# Patient Record
Sex: Female | Born: 1980 | Race: Black or African American | Hispanic: No | Smoking: Never smoker
Health system: Southern US, Community
[De-identification: ages and names within clinical notes are randomized; demographics above are authoritative.]

---

## 2010-08-28 HISTORY — PX: BREAST BIOPSY: SHX20

## 2011-01-24 ENCOUNTER — Other Ambulatory Visit: Payer: Self-pay | Admitting: Internal Medicine

## 2011-01-24 DIAGNOSIS — IMO0002 Reserved for concepts with insufficient information to code with codable children: Secondary | ICD-10-CM

## 2011-01-27 ENCOUNTER — Ambulatory Visit
Admission: RE | Admit: 2011-01-27 | Discharge: 2011-01-27 | Disposition: A | Discharge status: Home / Self Care | Payer: 59 | Source: Ambulatory Visit | Attending: Internal Medicine | Admitting: Internal Medicine

## 2011-01-27 ENCOUNTER — Other Ambulatory Visit: Payer: Self-pay | Admitting: Internal Medicine

## 2011-01-27 DIAGNOSIS — IMO0002 Reserved for concepts with insufficient information to code with codable children: Secondary | ICD-10-CM

## 2011-02-01 ENCOUNTER — Other Ambulatory Visit: Payer: Self-pay | Admitting: Diagnostic Radiology

## 2011-02-01 ENCOUNTER — Ambulatory Visit
Admission: RE | Admit: 2011-02-01 | Discharge: 2011-02-01 | Disposition: A | Discharge status: Home / Self Care | Payer: 59 | Source: Ambulatory Visit | Attending: Internal Medicine | Admitting: Internal Medicine

## 2011-02-01 DIAGNOSIS — IMO0002 Reserved for concepts with insufficient information to code with codable children: Secondary | ICD-10-CM

## 2011-12-28 IMAGING — MG MM DIGITAL DIAGNOSTIC BILAT {BCG}
5 series · 5 of 5 positions shown · non-contrast
Comparison: None.

CLINICAL DATA: Patient presents for a bilateral diagnostic
mammogram due to a palpable abnormality over the outer left breast.

DIGITAL DIAGNOSTIC BILATERAL MAMMOGRAM WITH CAD AND LEFT BREAST
ULTRASOUND:

[R CC]
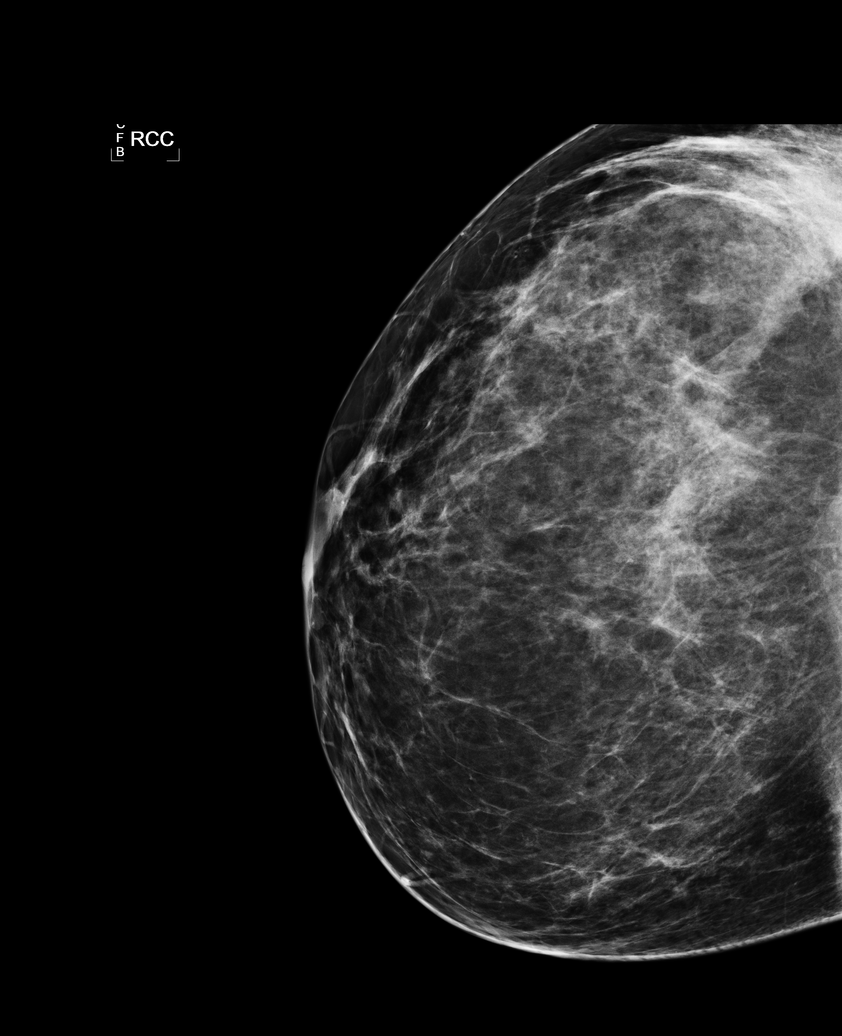

[L CC (1 of 2)]
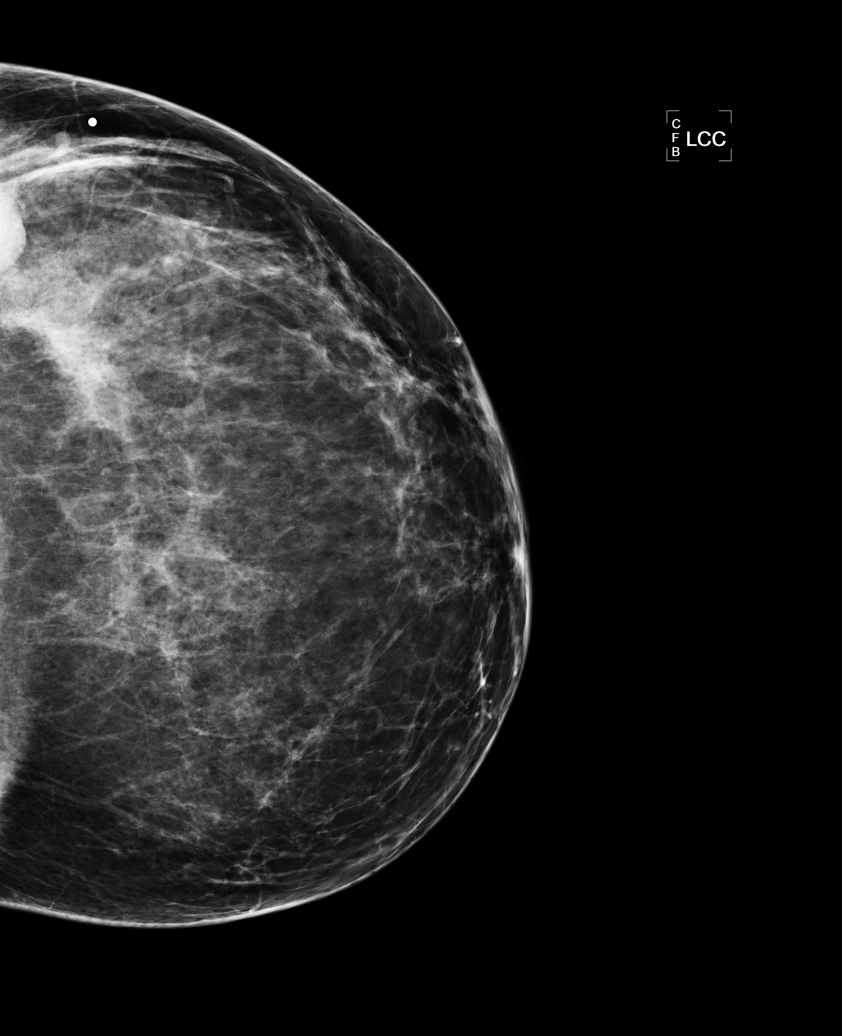

[L MLO]
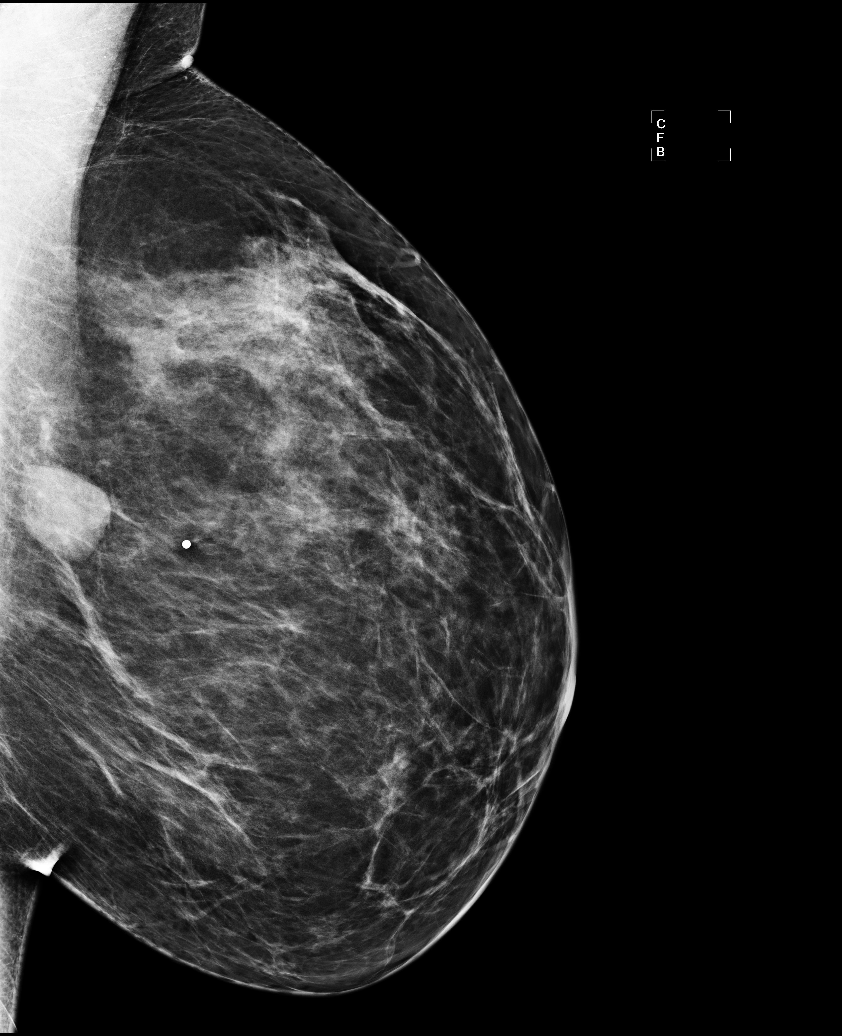

[R MLO]
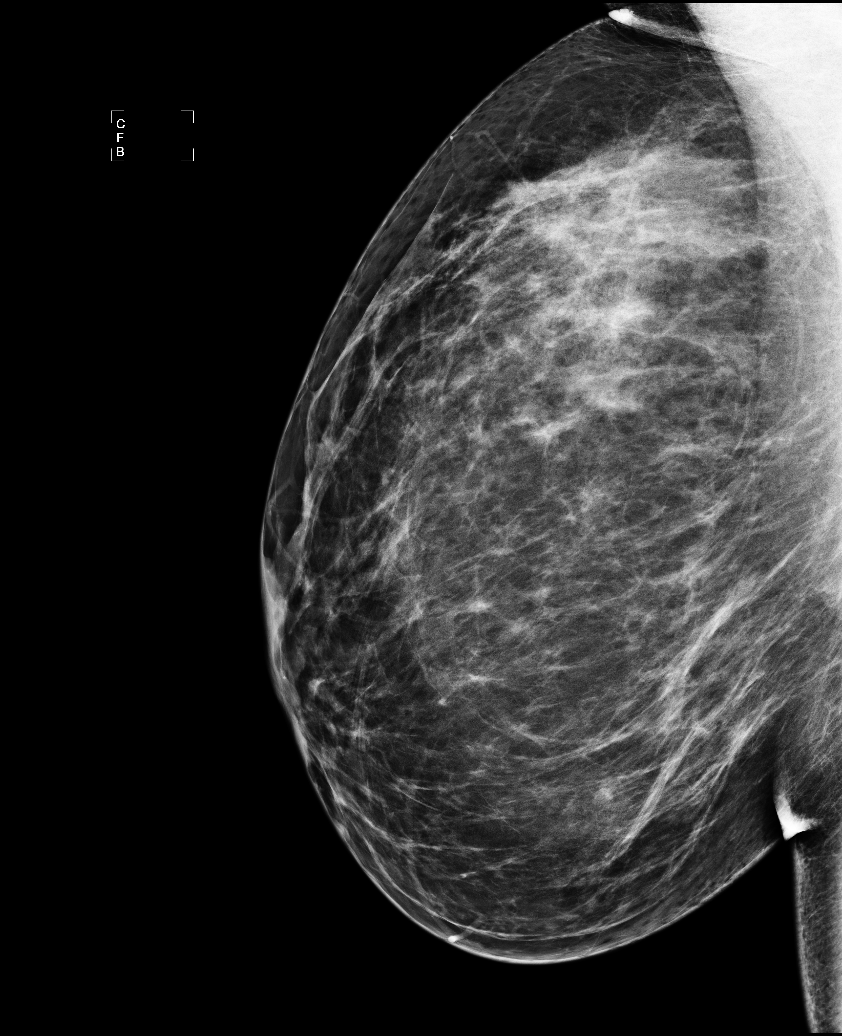

[L CC (2 of 2)]
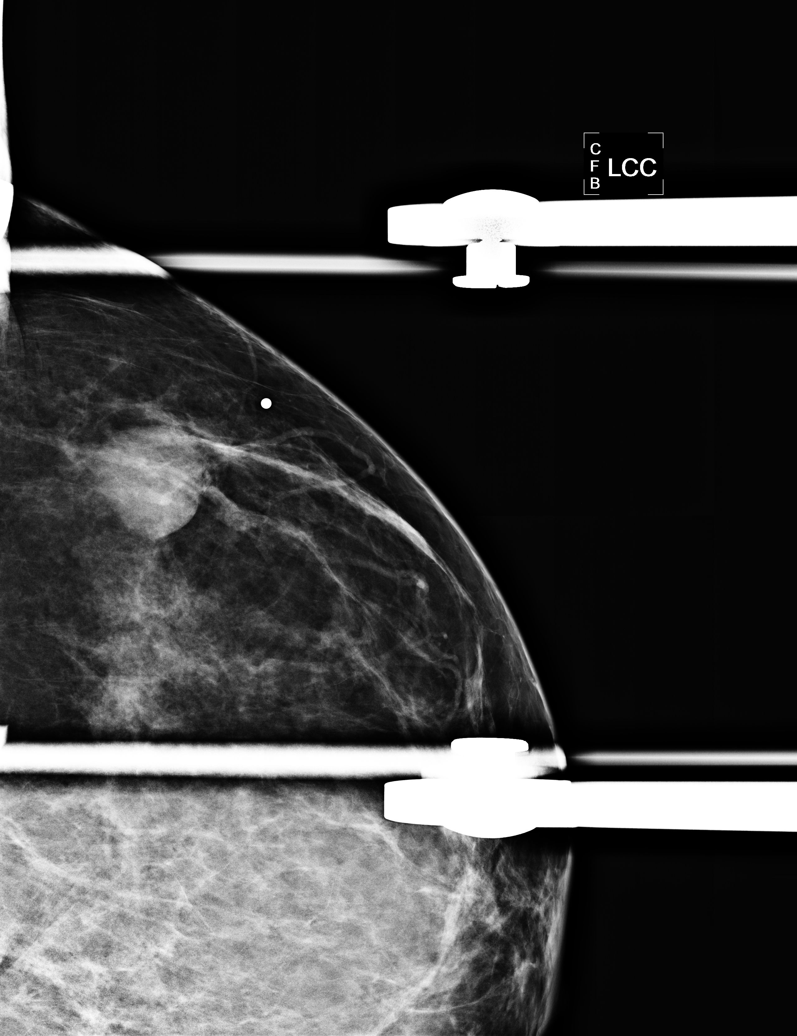

[5 of 5 positions shown; findings below may reference images not displayed]

FINDINGS: Exam demonstrates scattered fibroglandular densities.
There is a dense rounded mass of the outer mid left breast with
predominantly circumscribed borders but with some ill definition of
its posterior border.  Remainder of the exam is unremarkable.
Mammographic images were processed with CAD.

Ultrasound is performed, showing a rounded hypoechoic solid mass at
the 3 o'clock position of the left breast 15 cm from the nipple
corresponding to the palpable abnormality and mammographic mass.
This mass measures 1.4 x 1.9 x 2.4 cm.  There is mild lobulation of
the borders.
IMPRESSION: Hypoechoic solid mass measuring 1.4 x 1.9 x 2.4 cm over the 3
o'clock position of the left breast 15 cm from the nipple
corresponding to patient's palpable abnormality. This likely
represents a fibroadenoma, although there are a few atypical
features.

Recommendations: Would recommend ultrasound-guided core biopsy.

BI-RADS CATEGORY 4:  Suspicious abnormality - biopsy should be
considered.

Patient is scheduled for ultrasound-guided core biopsy 02/01/2011.

## 2012-01-02 IMAGING — MG MM DIAGNOSTIC UNILATERAL L
3 series · 3 of 3 positions shown · non-contrast
Comparison: Left breast biopsy 02/01/2011

CLINICAL DATA: Biopsy was performed of a palpable left breast
mass.

DIGITAL DIAGNOSTIC LEFT MAMMOGRAM

[L CC]
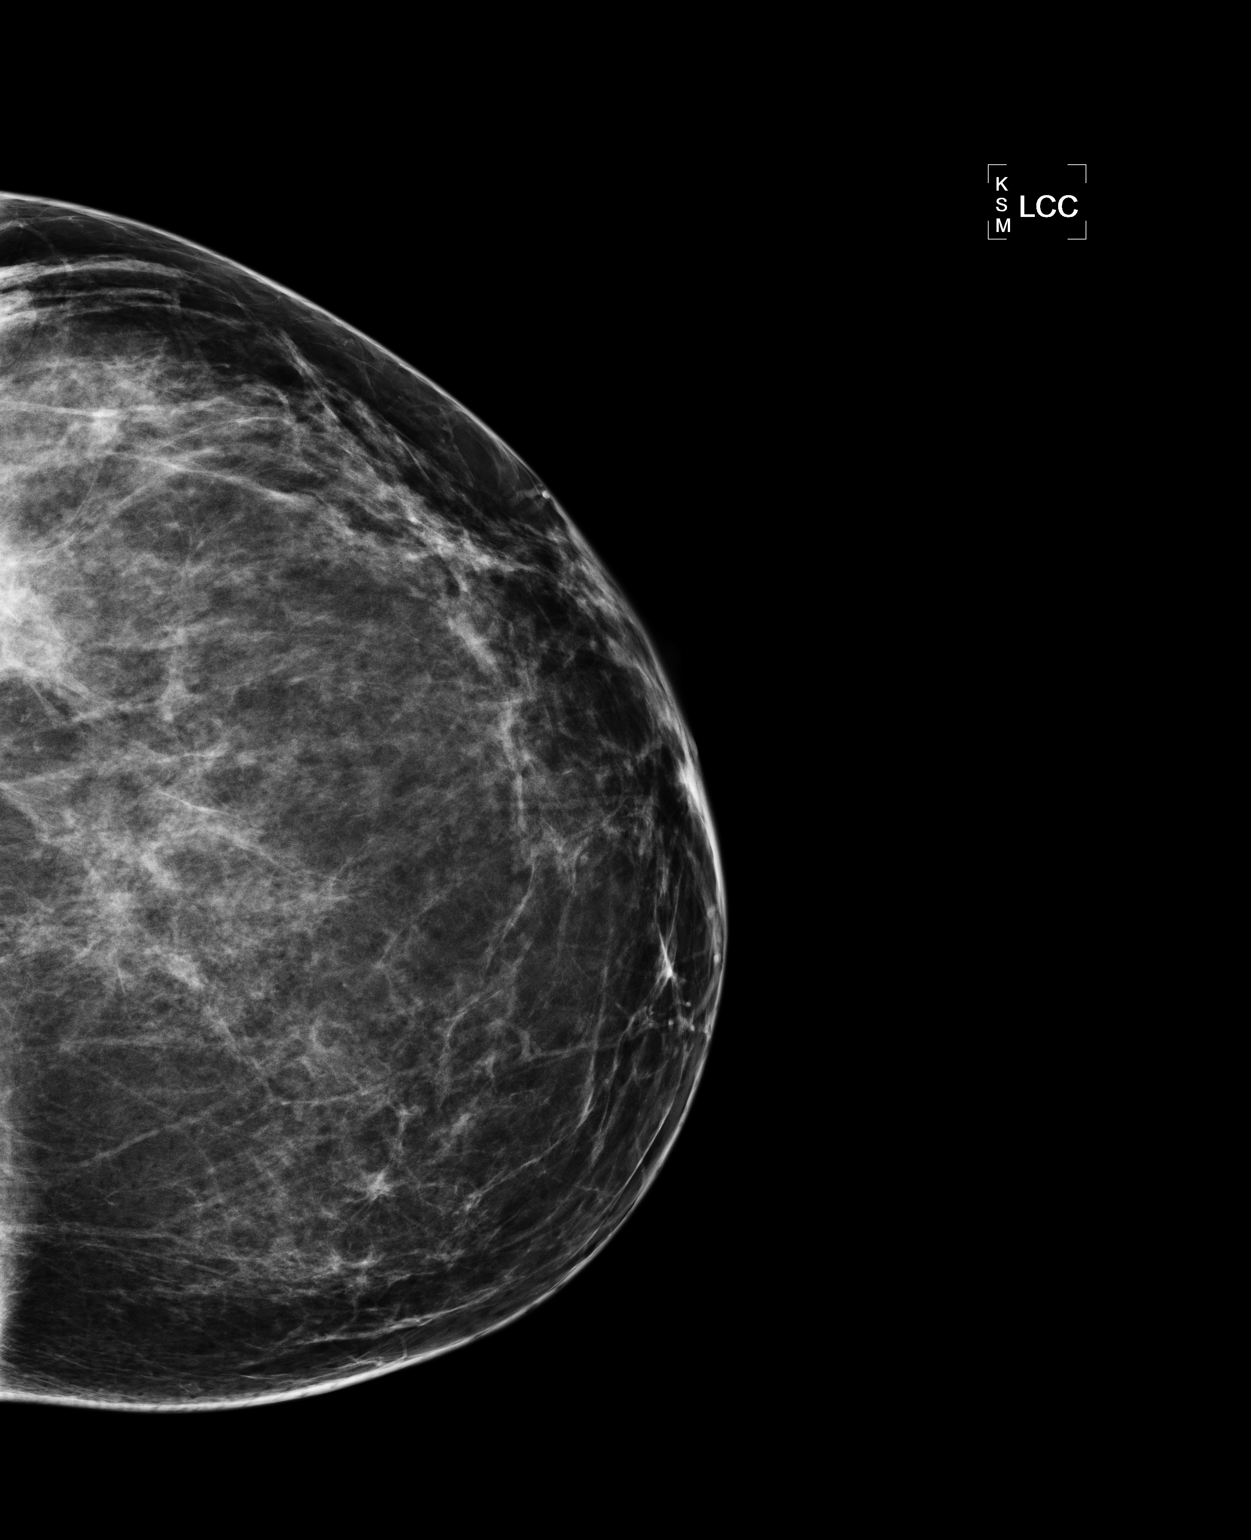

[L MLO]
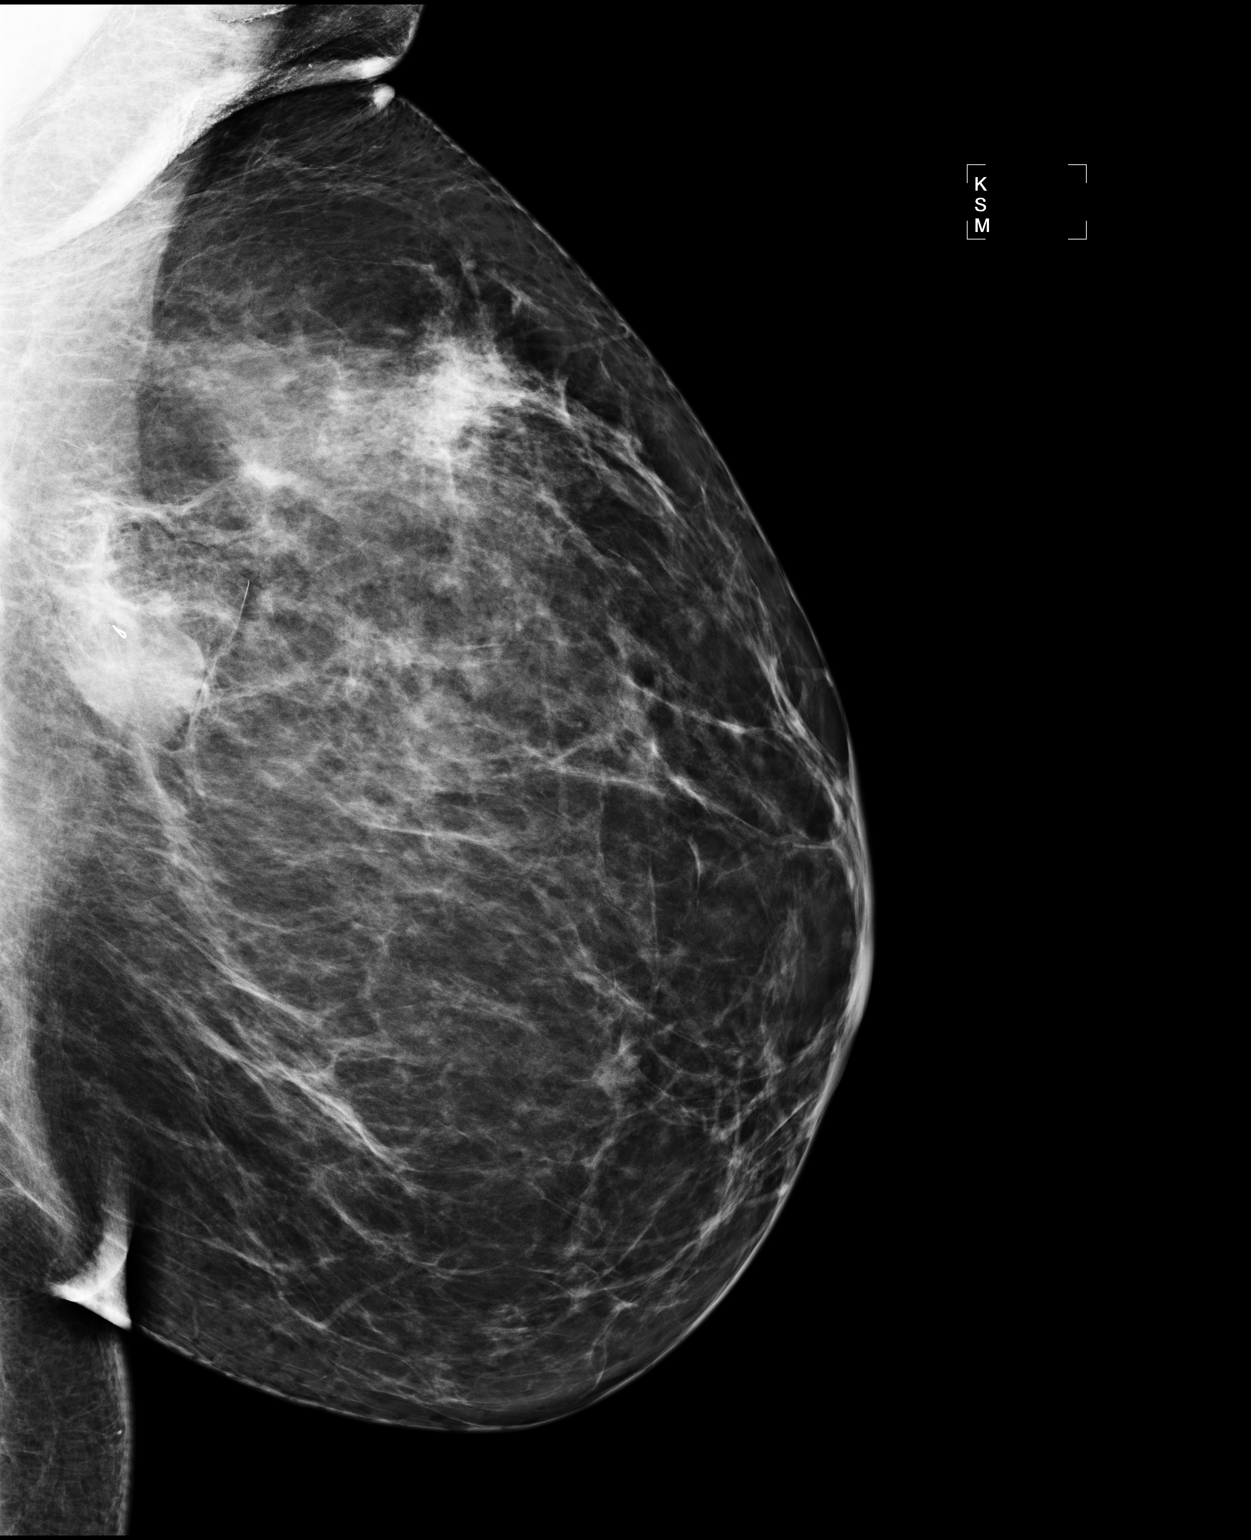

[L XCCL]
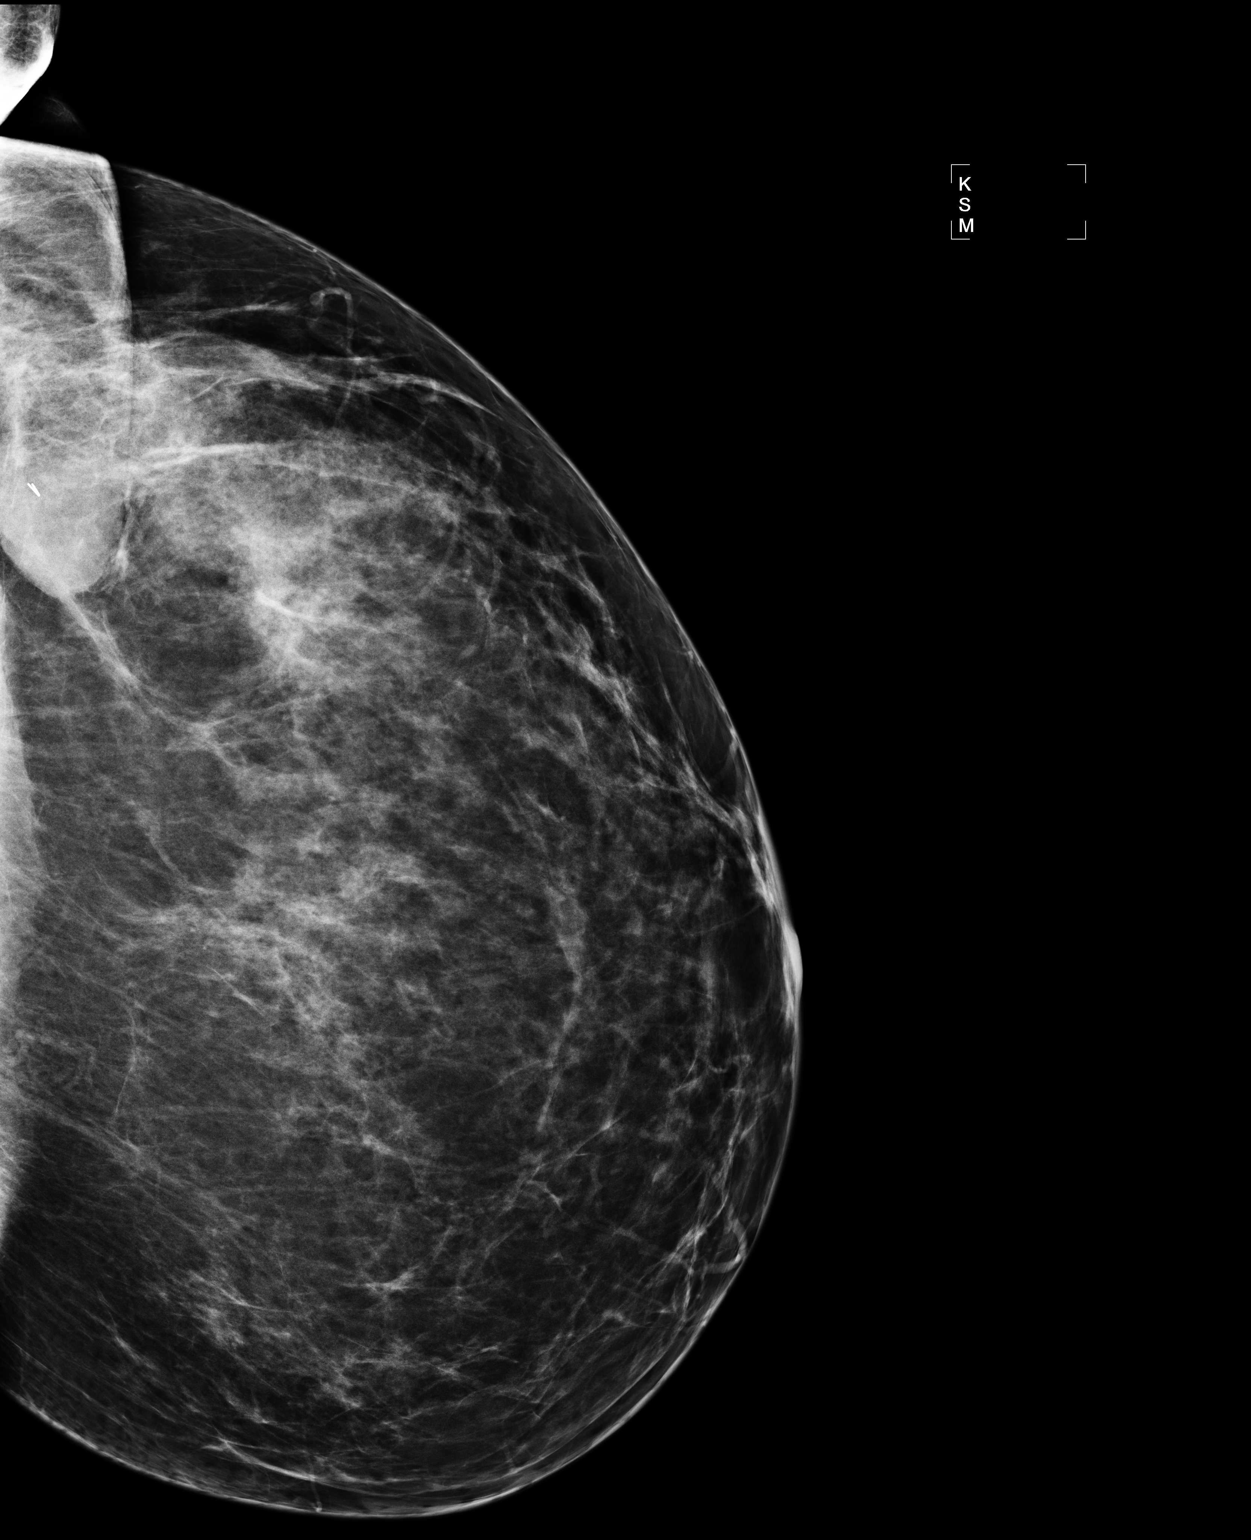

[3 of 3 positions shown; findings below may reference images not displayed]

FINDINGS: Films are performed following ultrasound guided biopsy
of a 2.4 cm palpable mass left breast three o'clock position.  The
biopsy clip is satisfactorily positioned within the mass.
IMPRESSION: Satisfactory position of biopsy clip in the biopsied left breast
mass.

## 2012-01-02 IMAGING — US US CORE BIOPSY
1 series · 9 of 9 positions shown · non-contrast
Comparison: none

CLINICAL DATA: Probable 1.4 cm solid mass left breast three
o'clock position.  Biopsy is recommended.

[Series 1: us core biopsy · 9 of 9 slices shown]
[im 1/9]
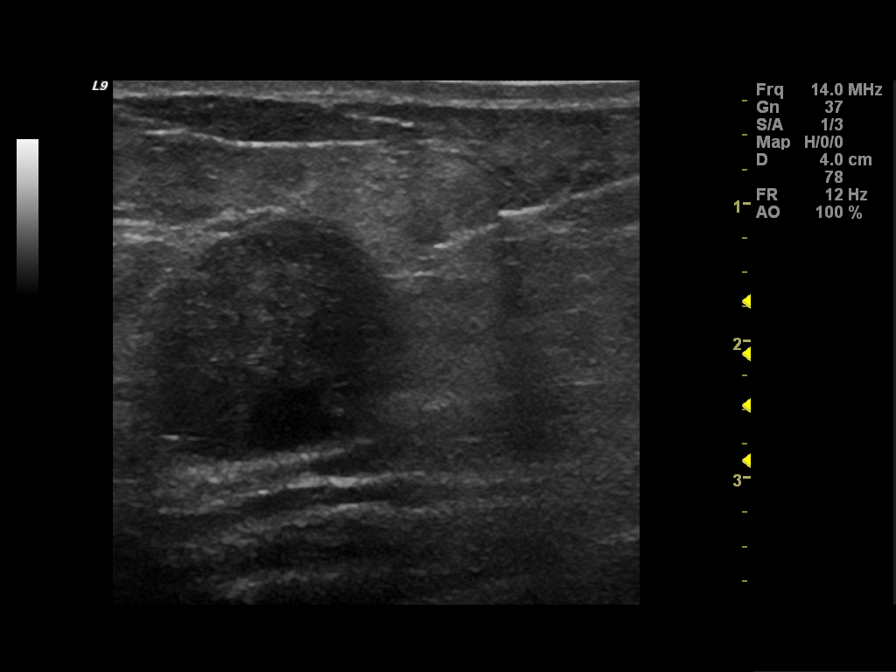
[im 2/9]
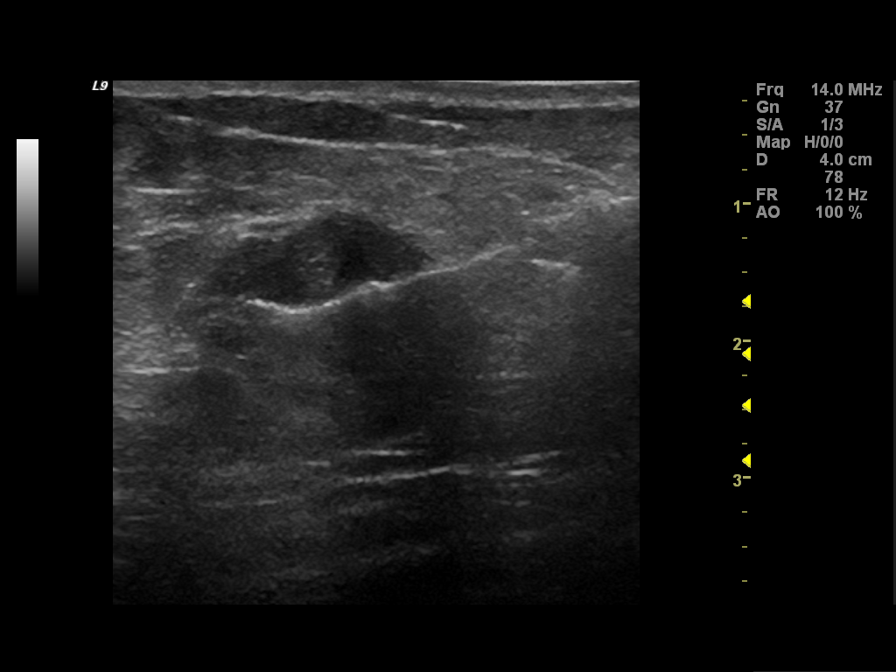
[im 3/9]
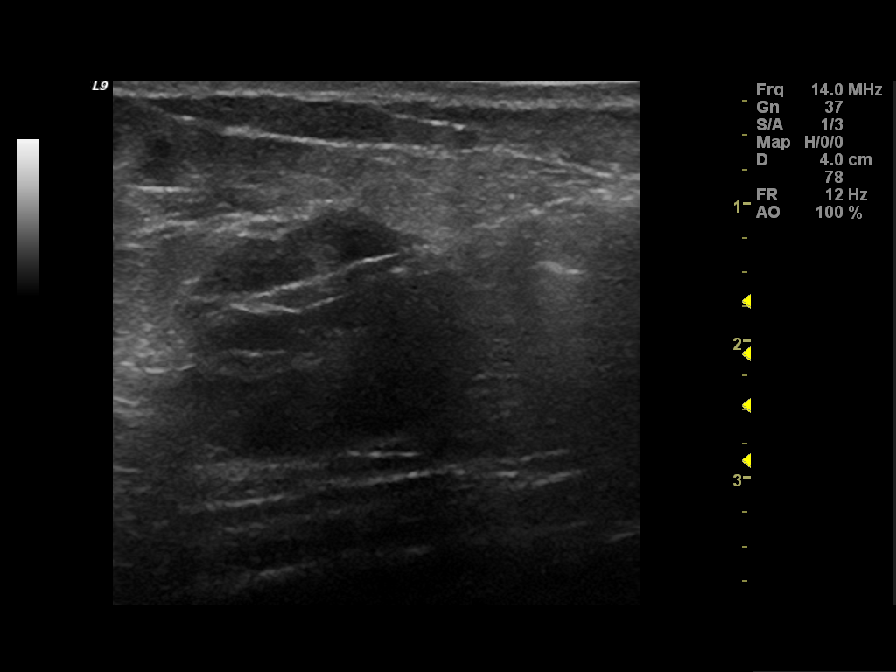
[im 4/9]
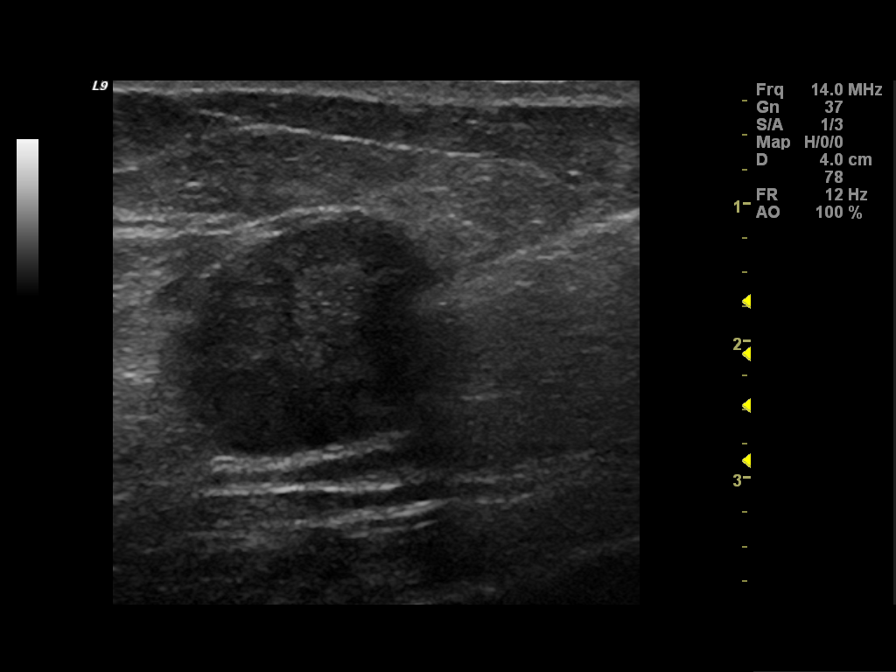
[im 5/9]
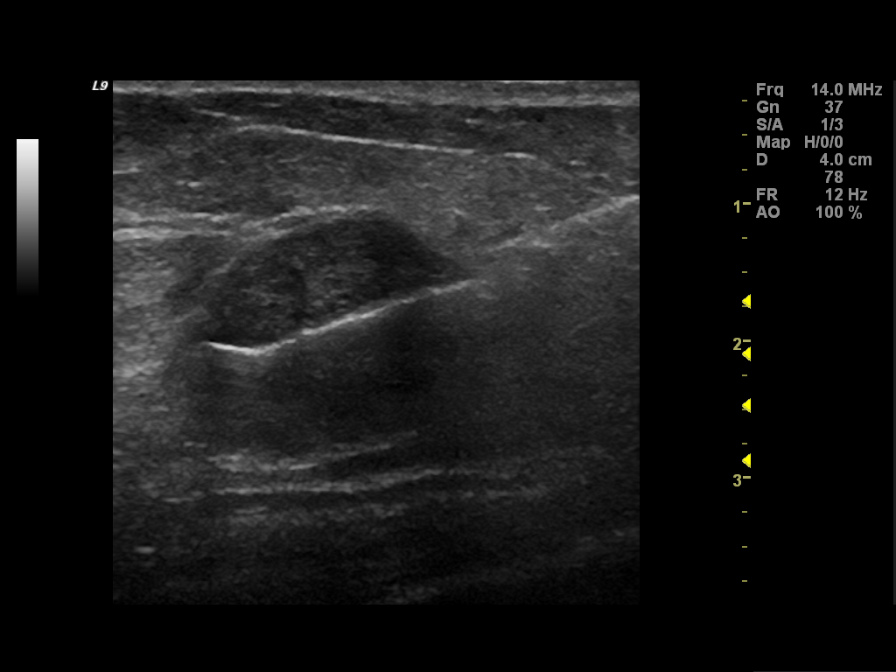
[im 6/9]
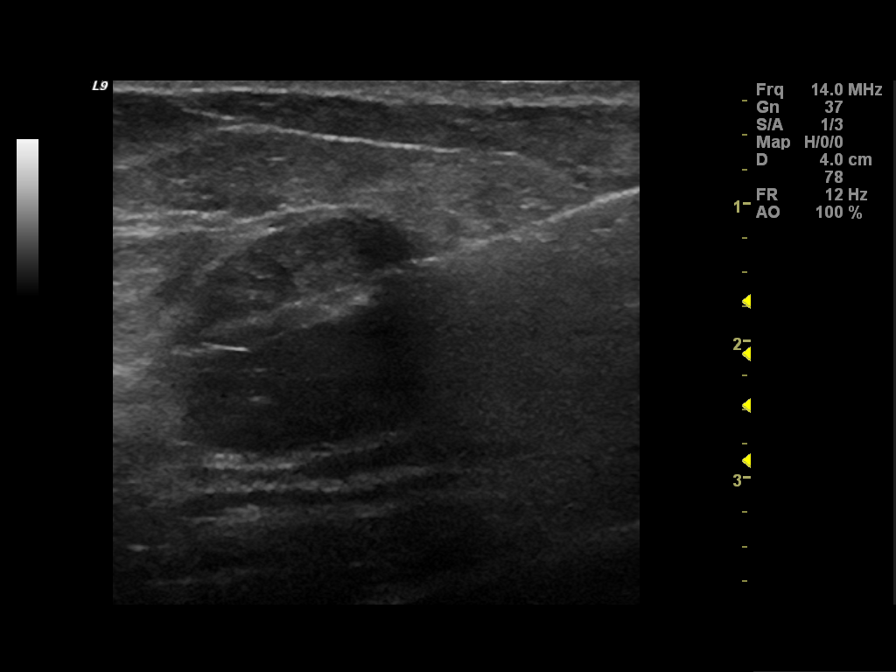
[im 7/9]
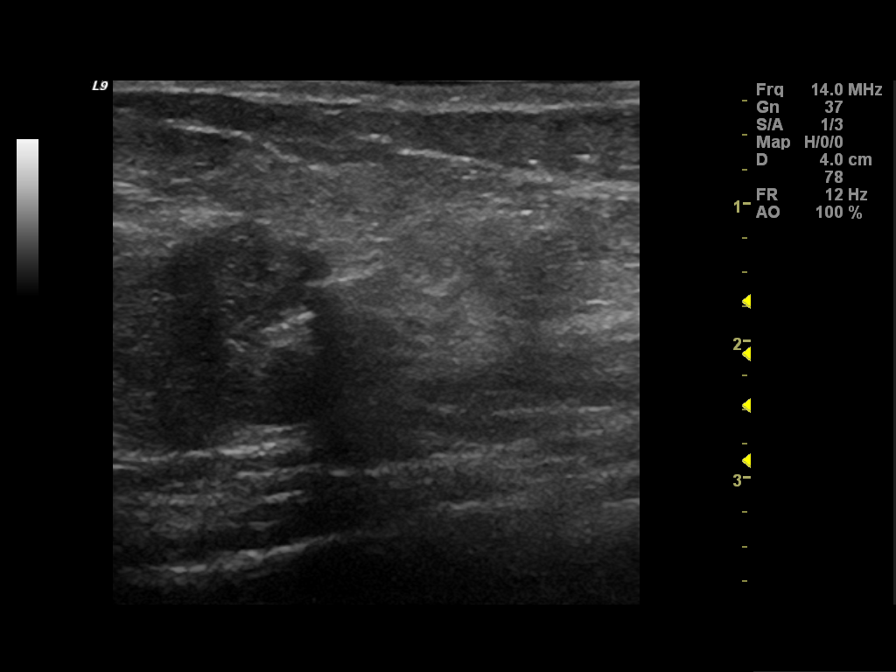
[im 8/9]
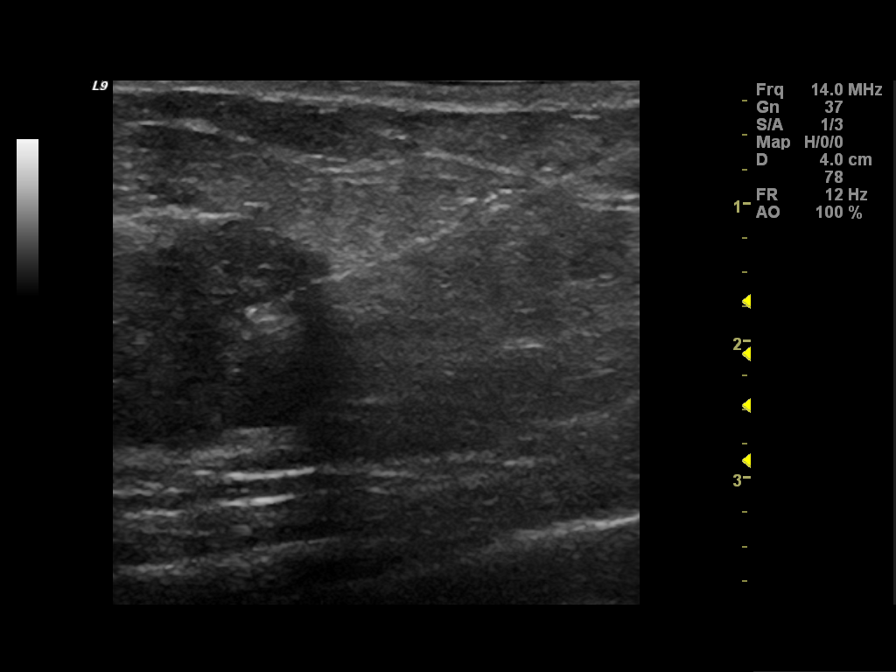
[im 9/9]
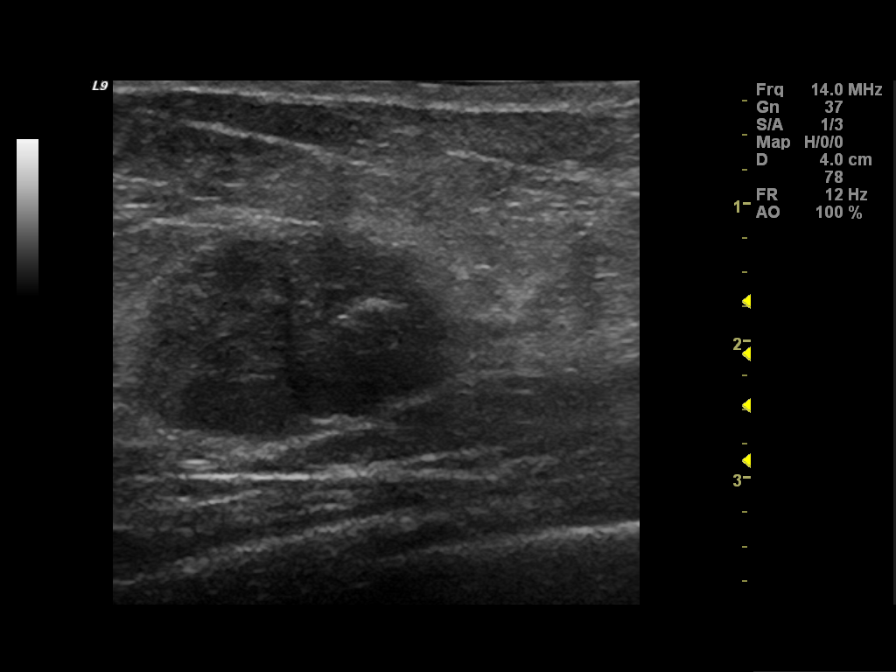

[9 of 9 positions shown; findings below may reference images not displayed]

ULTRASOUND GUIDED VACUUM ASSISTED CORE BIOPSY OF THE LEFT BREAST

I met with the patient, and we discussed the procedure of
ultrasound-guided biopsy, including risks, benefits, and
alternatives.  Specifically, we discussed the risks of infection,
bleeding, tissue injury, clip migration, and inadequate sampling.
Informed, written consent was given.

Using sterile technique, 2% lidocaine, ultrasound guidance, and a
12 gauge vacuum assisted needle, biopsy was performed of a left
breast mass at 3 o'clock position.  At the conclusion of the
procedure, a tissue marker clip was deployed into the biopsy
cavity.  Follow-up 2-view mammogram was performed and dictated
separately.
IMPRESSION: Ultrasound-guided biopsy of left breast mass.  No apparent
complications.

## 2019-05-02 ENCOUNTER — Other Ambulatory Visit: Payer: Self-pay | Admitting: Obstetrics and Gynecology

## 2019-05-02 ENCOUNTER — Other Ambulatory Visit (HOSPITAL_COMMUNITY)
Admission: RE | Admit: 2019-05-02 | Discharge: 2019-05-02 | Disposition: A | Discharge status: Home / Self Care | Payer: 59 | Source: Ambulatory Visit | Attending: Obstetrics and Gynecology | Admitting: Obstetrics and Gynecology

## 2019-05-02 DIAGNOSIS — Z124 Encounter for screening for malignant neoplasm of cervix: Secondary | ICD-10-CM | POA: Diagnosis present

## 2019-05-09 LAB — CYTOLOGY - PAP
Diagnosis: UNDETERMINED — AB
HPV 16/18/45 genotyping: POSITIVE — AB
HPV: DETECTED — AB

## 2019-05-16 ENCOUNTER — Other Ambulatory Visit: Payer: Self-pay | Admitting: Obstetrics and Gynecology

## 2020-01-15 ENCOUNTER — Ambulatory Visit: Payer: 59 | Attending: Internal Medicine

## 2020-01-15 DIAGNOSIS — Z23 Encounter for immunization: Secondary | ICD-10-CM

## 2020-01-15 NOTE — Progress Notes (Signed)
   Covid-19 Vaccination Clinic  Name:  Cheryl Joyce    MRN: [id number] DOB: 1981-02-14  01/15/2020  [name] was observed post Covid-19 immunization for 15 minutes without incident. She was provided with Vaccine Information Sheet and instruction to access the V-Safe system.   [name] was instructed to call 911 with any severe reactions post vaccine: Marland Kitchen Difficulty breathing  . Swelling of face and throat  . A fast heartbeat  . A bad rash all over body  . Dizziness and weakness   Immunizations Administered    Name Date Dose VIS Date Route   Pfizer COVID-19 Vaccine 01/15/2020  3:00 PM 0.3 mL 10/22/2018 Intramuscular   Manufacturer: [place]   Lot: [id number]   [place]: [id number]

## 2020-02-09 ENCOUNTER — Ambulatory Visit: Payer: 59 | Attending: Internal Medicine

## 2020-02-09 DIAGNOSIS — Z23 Encounter for immunization: Secondary | ICD-10-CM

## 2020-02-09 NOTE — Progress Notes (Signed)
° °  Covid-19 Vaccination Clinic  Name:  Cheryl Joyce    MRN: [id number] DOB: 06-Apr-1981  02/09/2020  Cheryl Joyce was observed post Covid-19 immunization for 15 minutes without incident. She was provided with Vaccine Information Sheet and instruction to access the V-Safe system.   Cheryl Joyce was instructed to call 911 with any severe reactions post vaccine:  Difficulty breathing   Swelling of face and throat   A fast heartbeat   A bad rash all over body   Dizziness and weakness   Immunizations Administered    Name Date Dose VIS Date Route   Pfizer COVID-19 Vaccine 02/09/2020  3:56 PM 0.3 mL 10/22/2018 Intramuscular   Manufacturer: [place]   Lot: [id number]   [place]: [address]-7

## 2020-12-13 ENCOUNTER — Ambulatory Visit
Admission: EM | Admit: 2020-12-13 | Discharge: 2020-12-13 | Disposition: A | Discharge status: Home / Self Care | Payer: 59

## 2020-12-13 ENCOUNTER — Other Ambulatory Visit: Payer: Self-pay

## 2020-12-13 DIAGNOSIS — R030 Elevated blood-pressure reading, without diagnosis of hypertension: Secondary | ICD-10-CM

## 2020-12-13 NOTE — ED Provider Notes (Signed)
[facility]    CSN: [id number] Arrival date & time: 12/13/20  1304      History   Chief Complaint Chief Complaint  Patient presents with  . Hypertension    HPI Cheryl Joyce is a 40 y.o. female.   HPI  Patient presents today with a concern for possible high blood pressure. Patient has been taking care of her mother who has been hospitalized over the last two weeks. She used her mother's wrist cuff to check her blood pressure and measure two readings greater than 140/80. No history of hypertension. No CPE within the last 2 years. Schedule to see PCP in 2 weeks. No HA, chest pain, or dizzines. Recent weight gain related to COVID as she no longer works out. Recent poor than baseline diet.   No past medical history on file.  There are no problems to display for this patient.   OB History   No obstetric history on file.      Home Medications    Prior to Admission medications   Not on File    Family History No family history on file.  Social History Social History   Tobacco Use  . Smoking status: Never Smoker  . Smokeless tobacco: Never Used  Substance Use Topics  . Alcohol use: Not Currently  . Drug use: Never     Allergies   Penicillins   Review of Systems Review of Systems Pertinent negatives listed in HPI  Physical Exam Triage Vital Signs ED Triage Vitals  Enc Vitals Group     BP 12/13/20 1406 (!) 145/93     Pulse Rate 12/13/20 1406 80     Resp 12/13/20 1406 20     Temp 12/13/20 1406 97.8 F (36.6 C)     Temp src --      SpO2 12/13/20 1406 95 %     Weight --      Height --      Head Circumference --      Peak Flow --      Pain Score 12/13/20 1404 0     Pain Loc --      Pain Edu? --      Excl. in [place]? --    No data found.  Updated Vital Signs BP (!) 145/93   Pulse 80   Temp 97.8 F (36.6 C)   Resp 20   LMP 12/12/2020   SpO2 95%   Visual Acuity Right Eye Distance:   Left Eye Distance:   Bilateral Distance:     Right Eye Near:   Left Eye Near:    Bilateral Near:     Physical Exam General appearance: alert, well developed, well nourished, cooperative and in no distress Head: Normocephalic, without obvious abnormality, atraumatic Respiratory: Respirations even and unlabored, normal respiratory rate Heart: Rate and rhythm normal. No gallop or murmurs noted on exam  Extremities: No gross deformities Skin: Skin color, texture, turgor normal. No rashes seen  Psych: Appropriate mood and affect. Neurologic: GCS 15, normal coordination, normal strength, normal gait   UC Treatments / Results  Labs (all labs ordered are listed, but only abnormal results are displayed) Labs Reviewed - No data to display  EKG   Radiology No results found.  Procedures Procedures (including critical care time)  Medications Ordered in UC Medications - No data to display  Initial Impression / Assessment and Plan / UC Course  I have reviewed the triage vital signs and the nursing notes.  Pertinent  labs & imaging results that were available during my care of the patient were reviewed by me and considered in my medical decision making (see chart for details).     Elevated blood pressure without diagnosis of hypertension. PCP follow-up for further work-up of symptoms.  No indication to start antihypertensive today. Purchase arm cuff. Keep a BP long daily. ER precaution given Final Clinical Impressions(s) / UC Diagnoses   Final diagnoses:  Elevated blood pressure reading without diagnosis of hypertension     Discharge Instructions     Your blood pressure is slightly elevated today however given lack of previous elevated readings I would like for you to obtain a arm blood pressure cuff and check your blood pressure at home at the same time of the day.   I have attached instructions on how to appropriately check your blood pressure on a consistent basis.  Also log your blood pressure readings.  You are  scheduled for follow-up with your primary care doctor within the next 3 weeks take a copy of your blood pressure readings with you to your next visit.    ED Prescriptions    None     PDMP not reviewed this encounter.   Scot Jun, FNP 12/20/20 2350

## 2020-12-13 NOTE — ED Triage Notes (Signed)
Pt states that she checked BP on Saturday and was  In 140s/ 90s. Just concerned and wants to be checked.

## 2020-12-13 NOTE — Discharge Instructions (Addendum)
Your blood pressure is slightly elevated today however given lack of previous elevated readings I would like for you to obtain a arm blood pressure cuff and check your blood pressure at home at the same time of the day.   I have attached instructions on how to appropriately check your blood pressure on a consistent basis.  Also log your blood pressure readings.  You are scheduled for follow-up with your primary care doctor within the next 3 weeks take a copy of your blood pressure readings with you to your next visit.

## 2020-12-29 ENCOUNTER — Encounter: Payer: Self-pay | Admitting: Nurse Practitioner

## 2020-12-29 ENCOUNTER — Ambulatory Visit (INDEPENDENT_AMBULATORY_CARE_PROVIDER_SITE_OTHER): Payer: 59 | Admitting: Nurse Practitioner

## 2020-12-29 ENCOUNTER — Other Ambulatory Visit: Payer: Self-pay

## 2020-12-29 VITALS — BP 128/74 | HR 65 | Temp 98.2°F | Ht 71.0 in | Wt 225.4 lb

## 2020-12-29 DIAGNOSIS — Z7689 Persons encountering health services in other specified circumstances: Secondary | ICD-10-CM

## 2020-12-29 DIAGNOSIS — Z6831 Body mass index (BMI) 31.0-31.9, adult: Secondary | ICD-10-CM

## 2020-12-29 DIAGNOSIS — E6609 Other obesity due to excess calories: Secondary | ICD-10-CM | POA: Diagnosis not present

## 2020-12-29 DIAGNOSIS — I10 Essential (primary) hypertension: Secondary | ICD-10-CM | POA: Diagnosis not present

## 2020-12-29 NOTE — Patient Instructions (Signed)
Preventing Hypertension Hypertension, also called high blood pressure, is when the force of blood pumping through the arteries is too strong. Arteries are blood vessels that carry blood from the heart throughout the body. Often, hypertension does not cause symptoms until blood pressure is very high. It is important to have your blood pressure checked regularly. Diet and lifestyle changes can help you prevent hypertension, and they may make you feel better overall and improve your quality of life. If you already have hypertension, you may control it with diet and lifestyle changes, as well as with medicine. How can this condition affect me? Over time, hypertension can damage the arteries and decrease blood flow to important parts of the body, including the brain, heart, and kidneys. By keeping your blood pressure in a healthy range, you can help prevent complications like heart attack, heart failure, stroke, kidney failure, and vascular dementia. What can increase my risk?  Being an older adult. Older people are more often affected.  Having family members who have had high blood pressure.  Being obese.  Being female. Males are more likely to have high blood pressure.  Drinking too much alcohol or caffeine.  Smoking or using illegal drugs.  Taking certain medicines, such as antidepressants, decongestants, birth control pills, and NSAIDs, such as ibuprofen.  Having thyroid problems.  Having certain tumors. What actions can I take to prevent or manage this condition? Work with your health care provider to make a hypertension prevention plan that works for you. Follow your plan and keep all follow-up visits as told by your health care provider. Diet changes Maintain a healthy diet. This includes:  Eating less salt (sodium). Ask your health care provider how much sodium is safe for you to have. The general recommendation is to have less than 1 tsp (2,300 mg) of sodium a day. ? Do not add salt  to your food. ? Choose low-sodium options when grocery shopping and eating out.  Limiting fats in your diet. You can do this by eating low-fat or fat-free dairy products and by eating less red meat.  Eating more fruits, vegetables, and whole grains. Make a goal to eat: ? 1-2 cups of fresh fruits and vegetables each day. ? 3-4 servings of whole grains each day.  Avoiding foods and beverages that have added sugars.  Eating fish that contain healthy fats (omega-3 fatty acids), such as mackerel or salmon. If you need help putting together a healthy eating plan, try the DASH diet. This diet is high in fruits, vegetables, and whole grains. It is low in sodium, red meat, and added sugars. DASH stands for Dietary Approaches to Stop Hypertension.   Lifestyle changes Lose weight if you are overweight. Losing just 3?5% of your body weight can help prevent or control hypertension. For example, if your present weight is 200 lb (91 kg), a loss of 3-5% of your weight means losing 6-10 lb (2.7-4.5 kg). Ask your health care provider to help you with a diet and exercise plan to safely lose weight. Other recommendations usually include:  Get enough exercise. Do at least 150 minutes of moderate-intensity exercise each week. You could do this in short exercise sessions several times a day, or you could do longer exercise sessions a few times a week. For example, you could take a brisk 10-minute walk or bike ride, 3 times a day, for 5 days a week.  Find ways to reduce stress, such as exercising, meditating, listening to music, or taking a yoga class.  If you need help reducing stress, ask your health care provider.  Do not use any products that contain nicotine or tobacco, such as cigarettes, e-cigarettes, and chewing tobacco. If you need help quitting, ask your health care provider. Chemicals in tobacco and nicotine products raise your blood pressure each time you use them. If you need help quitting, ask your health  care provider.  Learn how to check your blood pressure at home. Make sure that you know your personal target blood pressure, as told by your health care provider.  Try to sleep 7-9 hours per night.   Alcohol use  Do not drink alcohol if: ? Your health care provider tells you not to drink. ? You are pregnant, may be pregnant, or are planning to become pregnant.  If you drink alcohol: ? Limit how much you use to:  0-1 drink a day for women.  0-2 drinks a day for men. ? Be aware of how much alcohol is in your drink. In the [place] one drink equals one 12 oz bottle of beer (355 mL), one 5 oz glass of wine (148 mL), or one 1 oz glass of hard liquor (44 mL). Medicines In addition to diet and lifestyle changes, your health care provider may recommend medicines to help lower your blood pressure. In general:  You may need to try a few different medicines to find what works best for you.  You may need to take more than one medicine.  Take over-the-counter and prescription medicines only as told by your health care provider. Questions to ask your health care provider  What is my blood pressure goal?  How can I lower my risk for high blood pressure?  How should I monitor my blood pressure at home? Where to find support Your health care provider can help you prevent hypertension and help you keep your blood pressure at a healthy level. Your local hospital or your community may also provide support services and prevention programs. The American Heart Association offers an online support network at supportnetwork.heart.org Where to find more information Learn more about hypertension from:  [place], Lung, and [place]: https://wilson-eaton.com/  Centers for Disease Control and Prevention: http://www.wolf.info/  American Academy of Family Physicians: familydoctor.org Learn more about the DASH diet from:  [place], Lung, and [place]: https://wilson-eaton.com/ Contact a health care  provider if:  You think you are having a reaction to medicines you have taken.  You have recurrent headaches or feel dizzy.  You have swelling in your ankles.  You have trouble with your vision. Get help right away if:  You have sudden, severe chest, back, or abdominal pain or discomfort.  You have shortness of breath.  You have a sudden, severe headache. These symptoms may represent a serious problem that is an emergency. Do not wait to see if the symptoms will go away. Get medical help right away. Call your local emergency services (911 in the [place] Do not drive yourself to the hospital.  Summary  Hypertension often does not cause any symptoms until blood pressure is very high. It is important to get your blood pressure checked regularly.  Diet and lifestyle changes are important steps in preventing hypertension.  By keeping your blood pressure in a healthy range, you may prevent complications like heart attack, heart failure, stroke, and kidney failure.  Work with your health care provider to make a hypertension prevention plan that works for you. This information is not intended to replace advice given to  you by your health care provider. Make sure you discuss any questions you have with your health care provider. Document Revised: 07/15/2019 Document Reviewed: 07/15/2019 Elsevier Patient Education  2021 [employer].

## 2020-12-29 NOTE — Progress Notes (Signed)
I,Cheryl Joyce,acting as a Education administrator for [name], NP.,have documented all relevant documentation on the behalf of [name], NP,as directed by  Cheryl Castilla, NP while in the presence of Cheryl Castilla, NP.  This visit occurred during the SARS-CoV-2 public health emergency.  Safety protocols were in place, including screening questions prior to the visit, additional usage of staff PPE, and extensive cleaning of exam room while observing appropriate contact time as indicated for disinfecting solutions.  Subjective:     Patient ID: Cheryl Joyce , female    DOB: May 30, 1981 , 40 y.o.   MRN: [id number]   Chief Complaint  Patient presents with  . Establish Care    HPI  Patient is here to establish care. She was previously a patient of Dr Baird [facility] in 2018-2019.  But admits she became noncompliant. She went to urgent care about 2 weeks ago due to her blood pressure being high so she would like to get better control of her health. When she went to urgent care her blood pressure was elevated at around 148/93. Since then she has started going to the gym and her blood pressure has been lowering. She has a OBGYN so she will get a paps smear. She has been monitoring and working out at [name].  Diet: she doesn't eat a lot of fried food. She eats a lot of baked stuff. She eat fish and eats vegetables. She drinks smoothie.  Exercise: Treadmill at the gym. She drinks a lot of water.      No past medical history on file.   Family History  Problem Relation Age of Onset  . Hypertension Mother   . Hyperlipidemia Mother   . Thyroid disease Mother   . Cancer Sister   . Hypertension Maternal Grandmother   . Hypertension Maternal Grandfather   . Hypertension Paternal Grandmother   . Hypertension Paternal Grandfather      Current Outpatient Medications:  .  Cholecalciferol (VITAMIN D) 125 MCG (5000 UT) CAPS, 1 tablet, Disp: , Rfl:  .  Multiple Vitamin (MULTI VITAMIN) TABS, 1  tablet, Disp: , Rfl:    Allergies  Allergen Reactions  . Penicillins Itching and Rash     Review of Systems  Constitutional: Negative.  Negative for chills and fever.  HENT: Negative for congestion, sinus pain and sneezing.   Respiratory: Negative.  Negative for cough, shortness of breath and wheezing.   Cardiovascular: Negative.  Negative for chest pain and palpitations.  Gastrointestinal: Negative.   Musculoskeletal: Negative for back pain and myalgias.  Neurological: Negative for numbness and headaches.     Today's Vitals   12/29/20 0902  BP: 128/74  Pulse: 65  Temp: 98.2 F (36.8 C)  TempSrc: Oral  Weight: 225 lb 6.4 oz (102.2 kg)  Height: 5\' 11"  (1.803 m)   Body mass index is 31.44 kg/m.  Wt Readings from Last 3 Encounters:  12/29/20 225 lb 6.4 oz (102.2 kg)    Objective:  Physical Exam Constitutional:      Appearance: Normal appearance. She is obese.  HENT:     Head: Normocephalic and atraumatic.  Cardiovascular:     Rate and Rhythm: Normal rate and regular rhythm.     Pulses: Normal pulses.     Heart sounds: Normal heart sounds. No murmur heard.   Pulmonary:     Effort: Pulmonary effort is normal. No respiratory distress.     Breath sounds: Normal breath sounds.  Skin:    General: Skin is warm and  dry.     Capillary Refill: Capillary refill takes less than 2 seconds.  Neurological:     General: No focal deficit present.     Mental Status: She is alert and oriented to person, place, and time.  Psychiatric:        Mood and Affect: Mood normal.        Behavior: Behavior normal.         Assessment And Plan:     1. Establishing care with new doctor, encounter for -Patient is here to establish care. Cheryl Joyce over patient medical, family, social and surgical history. -Reviewed with patient their medications and any allergies  -Reviewed with patient their sexual orientation, drug/tobacco and alcohol use -Dicussed any new concerns with patient   -recommended patient comes in for a physical exam and complete blood work.  -Educated patient about the importance of annual screenings and immunizations.  -Advised patient to eat a healthy diet along with exercise for atleast 30-45 min atleast 4-5 days of the week.  Exercise for atleast 30-45 min for atleast 4-5 times a week.    2. Primary hypertension -Stable, patient is not on any meds at this time. -Will monitor at home and start exercise and diet.  -Limit the intake of processed foods and salt intake. You should increase your intake of green vegetables and fruits. Limit the use of alcohol. Limit fast foods and fried foods. Avoid high fatty saturated and trans fat foods. Keep yourself hydrated with drinking water. Avoid red meats. Eat lean meats instead. Exercise for atleast 30-45 min for atleast 4-5 times a week.   3. Class 1 obesity due to excess calories without serious comorbidity with body mass index (BMI) of 31.0 to 31.9 in adult She is encouraged to strive for BMI less than 30 to decrease cardiac risk. Advised to aim for at least 150 minutes of exercise per week.  Follow up: 1-3 months for a physical exam.   Patient was given opportunity to ask questions. Patient verbalized understanding of the plan and was able to repeat key elements of the plan. All questions were answered to their satisfaction.  Cheryl Castilla, DNP   Cheryl Joyce, [place] have reviewed all documentation for this visit. The documentation on 12/29/20  for the exam, diagnosis, procedures, and orders are all accurate and complete.   IF YOU HAVE BEEN REFERRED TO A SPECIALIST, IT MAY TAKE 1-2 WEEKS TO SCHEDULE/PROCESS THE REFERRAL. IF YOU HAVE NOT HEARD FROM US/SPECIALIST IN TWO WEEKS, PLEASE GIVE [place] A CALL AT [phone] X [phone].   THE PATIENT IS ENCOURAGED TO PRACTICE SOCIAL DISTANCING DUE TO THE COVID-19 PANDEMIC.

## 2021-03-31 ENCOUNTER — Encounter: Payer: Self-pay | Admitting: Nurse Practitioner

## 2021-03-31 ENCOUNTER — Other Ambulatory Visit: Payer: Self-pay

## 2021-03-31 ENCOUNTER — Ambulatory Visit (INDEPENDENT_AMBULATORY_CARE_PROVIDER_SITE_OTHER): Payer: 59 | Admitting: Nurse Practitioner

## 2021-03-31 VITALS — BP 120/84 | HR 96 | Temp 98.5°F | Ht 71.0 in | Wt 219.4 lb

## 2021-03-31 DIAGNOSIS — Z7689 Persons encountering health services in other specified circumstances: Secondary | ICD-10-CM | POA: Diagnosis not present

## 2021-03-31 DIAGNOSIS — Z0000 Encounter for general adult medical examination without abnormal findings: Secondary | ICD-10-CM | POA: Diagnosis not present

## 2021-03-31 DIAGNOSIS — E559 Vitamin D deficiency, unspecified: Secondary | ICD-10-CM

## 2021-03-31 DIAGNOSIS — D229 Melanocytic nevi, unspecified: Secondary | ICD-10-CM | POA: Diagnosis not present

## 2021-03-31 DIAGNOSIS — Z23 Encounter for immunization: Secondary | ICD-10-CM

## 2021-03-31 DIAGNOSIS — L918 Other hypertrophic disorders of the skin: Secondary | ICD-10-CM | POA: Diagnosis not present

## 2021-03-31 DIAGNOSIS — E6609 Other obesity due to excess calories: Secondary | ICD-10-CM

## 2021-03-31 DIAGNOSIS — Z6830 Body mass index (BMI) 30.0-30.9, adult: Secondary | ICD-10-CM

## 2021-03-31 NOTE — Patient Instructions (Signed)
Health Maintenance, Female Adopting a healthy lifestyle and getting preventive care are important in promoting health and wellness. Ask your health care provider about: The right schedule for you to have regular tests and exams. Things you can do on your own to prevent diseases and keep yourself healthy. What should I know about diet, weight, and exercise? Eat a healthy diet  Eat a diet that includes plenty of vegetables, fruits, low-fat dairy products, and lean protein. Do not eat a lot of foods that are high in solid fats, added sugars, or sodium.  Maintain a healthy weight Body mass index (BMI) is used to identify weight problems. It estimates body fat based on height and weight. Your health care provider can help determineyour BMI and help you achieve or maintain a healthy weight. Get regular exercise Get regular exercise. This is one of the most important things you can do for your health. Most adults should: Exercise for at least 150 minutes each week. The exercise should increase your heart rate and make you sweat (moderate-intensity exercise). Do strengthening exercises at least twice a week. This is in addition to the moderate-intensity exercise. Spend less time sitting. Even light physical activity can be beneficial. Watch cholesterol and blood lipids Have your blood tested for lipids and cholesterol at 41 years of age, then havethis test every 5 years. Have your cholesterol levels checked more often if: Your lipid or cholesterol levels are high. You are older than 40 years of age. You are at high risk for heart disease. What should I know about cancer screening? Depending on your health history and family history, you may need to have cancer screening at various ages. This may include screening for: Breast cancer. Cervical cancer. Colorectal cancer. Skin cancer. Lung cancer. What should I know about heart disease, diabetes, and high blood pressure? Blood pressure and heart  disease High blood pressure causes heart disease and increases the risk of stroke. This is more likely to develop in people who have high blood pressure readings, are of African descent, or are overweight. Have your blood pressure checked: Every 3-5 years if you are 18-39 years of age. Every year if you are 40 years old or older. Diabetes Have regular diabetes screenings. This checks your fasting blood sugar level. Have the screening done: Once every three years after age 40 if you are at a normal weight and have a low risk for diabetes. More often and at a younger age if you are overweight or have a high risk for diabetes. What should I know about preventing infection? Hepatitis B If you have a higher risk for hepatitis B, you should be screened for this virus. Talk with your health care provider to find out if you are at risk forhepatitis B infection. Hepatitis C Testing is recommended for: Everyone born from 1945 through 1965. Anyone with known risk factors for hepatitis C. Sexually transmitted infections (STIs) Get screened for STIs, including gonorrhea and chlamydia, if: You are sexually active and are younger than 41 years of age. You are older than 41 years of age and your health care provider tells you that you are at risk for this type of infection. Your sexual activity has changed since you were last screened, and you are at increased risk for chlamydia or gonorrhea. Ask your health care provider if you are at risk. Ask your health care provider about whether you are at high risk for HIV. Your health care provider may recommend a prescription medicine to help   prevent HIV infection. If you choose to take medicine to prevent HIV, you should first get tested for HIV. You should then be tested every 3 months for as long as you are taking the medicine. Pregnancy If you are about to stop having your period (premenopausal) and you may become pregnant, seek counseling before you get  pregnant. Take 400 to 800 micrograms (mcg) of folic acid every day if you become pregnant. Ask for birth control (contraception) if you want to prevent pregnancy. Osteoporosis and menopause Osteoporosis is a disease in which the bones lose minerals and strength with aging. This can result in bone fractures. If you are 65 years old or older, or if you are at risk for osteoporosis and fractures, ask your health care provider if you should: Be screened for bone loss. Take a calcium or vitamin D supplement to lower your risk of fractures. Be given hormone replacement therapy (HRT) to treat symptoms of menopause. Follow these instructions at home: Lifestyle Do not use any products that contain nicotine or tobacco, such as cigarettes, e-cigarettes, and chewing tobacco. If you need help quitting, ask your health care provider. Do not use street drugs. Do not share needles. Ask your health care provider for help if you need support or information about quitting drugs. Alcohol use Do not drink alcohol if: Your health care provider tells you not to drink. You are pregnant, may be pregnant, or are planning to become pregnant. If you drink alcohol: Limit how much you use to 0-1 drink a day. Limit intake if you are breastfeeding. Be aware of how much alcohol is in your drink. In the [place] one drink equals one 12 oz bottle of beer (355 mL), one 5 oz glass of wine (148 mL), or one 1 oz glass of hard liquor (44 mL). General instructions Schedule regular health, dental, and eye exams. Stay current with your vaccines. Tell your health care provider if: You often feel depressed. You have ever been abused or do not feel safe at home. Summary Adopting a healthy lifestyle and getting preventive care are important in promoting health and wellness. Follow your health care provider's instructions about healthy diet, exercising, and getting tested or screened for diseases. Follow your health care provider's  instructions on monitoring your cholesterol and blood pressure. This information is not intended to replace advice given to you by your health care provider. Make sure you discuss any questions you have with your healthcare provider. Document Revised: 08/07/2018 Document Reviewed: 08/07/2018 Elsevier Patient Education  2022 Elsevier Inc.  

## 2021-03-31 NOTE — Progress Notes (Signed)
I,Katawbba Wiggins,acting as a Education administrator for [name], NP.,have documented all relevant documentation on the behalf of [name], NP,as directed by  Bary Castilla, NP while in the presence of Bary Castilla, NP.  This visit occurred during the SARS-CoV-2 public health emergency.  Safety protocols were in place, including screening questions prior to the visit, additional usage of staff PPE, and extensive cleaning of exam room while observing appropriate contact time as indicated for disinfecting solutions.  Subjective:     Patient ID: Cheryl Joyce , female    DOB: 09-06-1980 , 40 y.o.   MRN: [id number]   Chief Complaint  Patient presents with   Annual Exam    HPI  The patient is here today for a physical examination. The patient is established with Joneen Caraway, last pap was 2020, next appt is October 2022. She will schedule her appt to get a mammogram.  Diet: She doesn't eat fried or fast food all the time. She eats a lot of sea food. She cooks a lot at home.  Exercise: She has been working out. She tries to go to the gym 3 days a week. She does weight lifting at home x 5 days a week.  Drink: no  Smoke: No  LMP: July 15th 2022 No BC   Other Pertinent negatives include no arthralgias, chest pain, chills, congestion, coughing, fever, headaches, myalgias, nausea, numbness or weakness.    No past medical history on file.   Family History  Problem Relation Age of Onset   Hypertension Mother    Hyperlipidemia Mother    Thyroid disease Mother    Cancer Sister    Hypertension Maternal Grandmother    Hypertension Maternal Grandfather    Hypertension Paternal Grandmother    Hypertension Paternal Grandfather      Current Outpatient Medications:    Cholecalciferol (VITAMIN D) 125 MCG (5000 UT) CAPS, 1 tablet, Disp: , Rfl:    Multiple Vitamin (MULTI VITAMIN) TABS, 1 tablet, Disp: , Rfl:    Allergies  Allergen Reactions   Penicillins Itching and Rash       The patient states she uses none for birth control. Last LMP was Patient's last menstrual period was 03/11/2021.. Negative for Dysmenorrhea. Negative for: breast discharge, breast lump(s), breast pain and breast self exam. Associated symptoms include abnormal vaginal bleeding. Pertinent negatives include abnormal bleeding (hematology), anxiety, decreased libido, depression, difficulty falling sleep, dyspareunia, history of infertility, nocturia, sexual dysfunction, sleep disturbances, urinary incontinence, urinary urgency, vaginal discharge and vaginal itching. Diet regular.The patient states her exercise level is    . The patient's tobacco use is:  Social History   Tobacco Use  Smoking Status Never  Smokeless Tobacco Never  . She has been exposed to passive smoke. The patient's alcohol use is:  Social History   Substance and Sexual Activity  Alcohol Use Not Currently  . Additional information: Last pap 2020, next one scheduled for 2022.    Review of Systems  Constitutional:  Negative for chills and fever.  HENT: Negative.  Negative for congestion, rhinorrhea and sinus pain.   Eyes: Negative.   Respiratory: Negative.  Negative for cough, shortness of breath and wheezing.   Cardiovascular: Negative.  Negative for chest pain and palpitations.  Gastrointestinal: Negative.  Negative for constipation, diarrhea and nausea.  Endocrine: Negative for polydipsia, polyphagia and polyuria.  Genitourinary: Negative.   Musculoskeletal: Negative.  Negative for arthralgias and myalgias.  Skin: Negative.   Allergic/Immunologic: Negative.   Neurological: Negative.  Negative for  weakness, numbness and headaches.  Hematological: Negative.   Psychiatric/Behavioral: Negative.      Today's Vitals   03/31/21 0839  BP: 120/84  Pulse: 96  Temp: 98.5 F (36.9 C)  TempSrc: Oral  Weight: 219 lb 6.4 oz (99.5 kg)  Height: $Remove'5\' 11"'REfNGqh$  (1.803 m)   Body mass index is 30.6 kg/m.  Wt Readings from Last 3  Encounters:  03/31/21 219 lb 6.4 oz (99.5 kg)  12/29/20 225 lb 6.4 oz (102.2 kg)    BP Readings from Last 3 Encounters:  03/31/21 120/84  12/29/20 128/74  12/13/20 (!) 145/93    Objective:  Physical Exam Vitals and nursing note reviewed.  Constitutional:      Appearance: Normal appearance.  HENT:     Head: Normocephalic and atraumatic.     Right Ear: Tympanic membrane, ear canal and external ear normal.     Left Ear: Tympanic membrane, ear canal and external ear normal.     Nose: Nose normal.     Mouth/Throat:     Mouth: Mucous membranes are moist.     Pharynx: Oropharynx is clear.  Eyes:     Extraocular Movements: Extraocular movements intact.     Conjunctiva/sclera: Conjunctivae normal.     Pupils: Pupils are equal, round, and reactive to light.  Cardiovascular:     Rate and Rhythm: Normal rate and regular rhythm.     Pulses: Normal pulses.     Heart sounds: Normal heart sounds.  Pulmonary:     Effort: Pulmonary effort is normal.     Breath sounds: Normal breath sounds.  Chest:  Breasts:    Tanner Score is 5.  Abdominal:     General: Abdomen is flat. Bowel sounds are normal.     Palpations: Abdomen is soft.  Genitourinary:    Comments: Deferred. Pt sees a OBGYN  Musculoskeletal:        General: Normal range of motion.     Cervical back: Normal range of motion and neck supple.  Skin:    General: Skin is warm and dry.     Capillary Refill: Capillary refill takes less than 2 seconds.     Comments: Multiple skin tags and moles around face and chest   Neurological:     General: No focal deficit present.     Mental Status: She is alert and oriented to person, place, and time.  Psychiatric:        Mood and Affect: Mood normal.        Behavior: Behavior normal.        Assessment And Plan:     1. Establishing care with new doctor, encounter for --Patient is here for their annual physical exam and we discussed any changes to medication and medical history.   -Behavior modification was discussed as well as diet and exercise history  -Patient will continue to exercise regularly and modify their diet.  -Recommendation for yearly physical annuals, immunization and screenings including mammogram and colonoscopy were discussed with the patient.  -Recommended intake of multivitamin, vitamin D and calcium.  -Individualized advise was given to the patient pertaining to their own health history in regards to diet, exercise, medical condition and referrals.  -Patient has no concerns today.  - CBC - CMP14+EGFR - Lipid panel - Hemoglobin A1c  2. Immunization due - Tdap vaccine greater than or equal to 7yo IM  3. Vitamin D deficiency -Will check and supplement if needed. Advised patient to spend atleast 15 min. Daily in sunlight.  -  Vitamin D (25 hydroxy)  4. Multiple acquired skin tags -Patient states her mother has skin tags as well. Will put in a referral for her to see dermatology - Ambulatory referral to Dermatology  5. Numerous skin moles -Educated patient to note any color changes, size, symmetry, border and diameter of moles.  - Ambulatory referral to Dermatology  6. Class 1 obesity due to excess calories without serious comorbidity with body mass index (BMI) of 30.0 to 30.9 in adult Advised patient on a healthy diet including avoiding fast food and red meats. Increase the intake of lean meats including grilled chicken and [place].  Drink a lot of water. Decrease intake of fatty foods. Exercise for 30-45 min. 4-5 a week to decrease the risk of cardiac event.   The patient was encouraged to call or send a message through [place] for any questions or concerns.   Follow up:  1 year follow up  Side effects and appropriate use of all the medication(s) were discussed with the patient today. Patient advised to use the medication(s) as directed by their healthcare provider. The patient was encouraged to read, review, and understand all associated  package inserts and contact our office with any questions or concerns. The patient accepts the risks of the treatment plan and had an opportunity to ask questions.   Staying healthy and adopting a healthy lifestyle for your overall health is important. You should eat 7 or more servings of fruits and vegetables per day. You should drink plenty of water to keep yourself hydrated and your kidneys healthy. This includes about 65-80+ fluid ounces of water. Limit your intake of animal fats especially for elevated cholesterol. Avoid highly processed food and limit your salt intake if you have hypertension. Avoid foods high in saturated/Trans fats. Along with a healthy diet it is also very important to maintain time for yourself to maintain a healthy mental health with low stress levels. You should get atleast 150 min of moderate intensity exercise weekly for a healthy heart. Along with eating right and exercising, aim for at least 7-9 hours of sleep daily.  Eat more whole grains which includes barley, wheat berries, oats, brown rice and whole wheat pasta. Use healthy plant oils which include olive, soy, corn, sunflower and peanut. Limit your caffeine and sugary drinks. Limit your intake of fast foods. Limit milk and dairy products to one or two daily servings.   Patient was given opportunity to ask questions. Patient verbalized understanding of the plan and was able to repeat key elements of the plan. All questions were answered to their satisfaction.  Raman Maekayla Giorgio, DNP   I, Raman Khamille Beynon have reviewed all documentation for this visit. The documentation on 03/31/21 for the exam, diagnosis, procedures, and orders are all accurate and complete.    THE PATIENT IS ENCOURAGED TO PRACTICE SOCIAL DISTANCING DUE TO THE COVID-19 PANDEMIC.

## 2021-04-01 LAB — LIPID PANEL
Chol/HDL Ratio: 4.7 ratio — ABNORMAL HIGH (ref 0.0–4.4)
Cholesterol, Total: 240 mg/dL — ABNORMAL HIGH (ref 100–199)
HDL: 51 mg/dL (ref >39)
LDL Chol Calc (NIH): 173 mg/dL — ABNORMAL HIGH (ref 0–99)
Triglycerides: 89 mg/dL (ref 0–149)
VLDL Cholesterol Cal: 16 mg/dL (ref 5–40)

## 2021-04-01 LAB — CMP14+EGFR
ALT: 12 IU/L (ref 0–32)
AST: 18 IU/L (ref 0–40)
Albumin/Globulin Ratio: 1.4 (ref 1.2–2.2)
Albumin: 4.5 g/dL (ref 3.8–4.8)
Alkaline Phosphatase: 79 IU/L (ref 44–121)
BUN/Creatinine Ratio: 13 (ref 9–23)
BUN: 10 mg/dL (ref 6–24)
Bilirubin Total: 0.3 mg/dL (ref 0.0–1.2)
CO2: 22 mmol/L (ref 20–29)
Calcium: 10.2 mg/dL (ref 8.7–10.2)
Chloride: 101 mmol/L (ref 96–106)
Creatinine, Ser: 0.8 mg/dL (ref 0.57–1.00)
Globulin, Total: 3.2 g/dL (ref 1.5–4.5)
Glucose: 88 mg/dL (ref 65–99)
Potassium: 4.9 mmol/L (ref 3.5–5.2)
Sodium: 137 mmol/L (ref 134–144)
Total Protein: 7.7 g/dL (ref 6.0–8.5)
eGFR: 95 mL/min/{1.73_m2} (ref >59)

## 2021-04-01 LAB — CBC
Hematocrit: 40.2 % (ref 34.0–46.6)
Hemoglobin: 13.4 g/dL (ref 11.1–15.9)
MCH: 26.9 pg (ref 26.6–33.0)
MCHC: 33.3 g/dL (ref 31.5–35.7)
MCV: 81 fL (ref 79–97)
Platelets: 209 10*3/uL (ref 150–450)
RBC: 4.98 x10E6/uL (ref 3.77–5.28)
RDW: 12.9 % (ref 11.7–15.4)
WBC: 6.2 10*3/uL (ref 3.4–10.8)

## 2021-04-01 LAB — HEMOGLOBIN A1C
Est. average glucose Bld gHb Est-mCnc: 105 mg/dL
Hgb A1c MFr Bld: 5.3 % (ref 4.8–5.6)

## 2021-04-01 LAB — VITAMIN D 25 HYDROXY (VIT D DEFICIENCY, FRACTURES): Vit D, 25-Hydroxy: 82.5 ng/mL (ref 30.0–100.0)

## 2021-04-05 ENCOUNTER — Telehealth: Payer: Self-pay

## 2021-04-05 ENCOUNTER — Other Ambulatory Visit: Payer: Self-pay | Admitting: Nurse Practitioner

## 2021-04-05 DIAGNOSIS — E782 Mixed hyperlipidemia: Secondary | ICD-10-CM

## 2021-04-05 MED ORDER — ATORVASTATIN CALCIUM 10 MG PO TABS: ORAL_TABLET | ORAL | 2 refills | Status: DC

## 2021-04-05 NOTE — Telephone Encounter (Signed)
The patient states that she is willing to start a low dose cholesterol med, taking it 3 days per week.  The pt said she was trying to respond to the message from [place], [place], FNP-BC

## 2021-06-01 ENCOUNTER — Other Ambulatory Visit: Payer: Self-pay | Admitting: Nurse Practitioner

## 2021-06-01 DIAGNOSIS — Z1231 Encounter for screening mammogram for malignant neoplasm of breast: Secondary | ICD-10-CM

## 2021-06-29 ENCOUNTER — Other Ambulatory Visit: Payer: Self-pay

## 2021-06-29 ENCOUNTER — Ambulatory Visit
Admission: RE | Admit: 2021-06-29 | Discharge: 2021-06-29 | Disposition: A | Discharge status: Home / Self Care | Payer: 59 | Source: Ambulatory Visit | Attending: Nurse Practitioner | Admitting: Nurse Practitioner

## 2021-06-29 DIAGNOSIS — Z1231 Encounter for screening mammogram for malignant neoplasm of breast: Secondary | ICD-10-CM

## 2021-10-11 ENCOUNTER — Ambulatory Visit (INDEPENDENT_AMBULATORY_CARE_PROVIDER_SITE_OTHER): Payer: PRIVATE HEALTH INSURANCE | Admitting: Dermatology

## 2021-10-11 ENCOUNTER — Other Ambulatory Visit: Payer: Self-pay

## 2021-10-11 DIAGNOSIS — Z1283 Encounter for screening for malignant neoplasm of skin: Secondary | ICD-10-CM | POA: Diagnosis not present

## 2021-10-11 DIAGNOSIS — L821 Other seborrheic keratosis: Secondary | ICD-10-CM

## 2021-10-11 DIAGNOSIS — L918 Other hypertrophic disorders of the skin: Secondary | ICD-10-CM | POA: Diagnosis not present

## 2021-10-26 ENCOUNTER — Other Ambulatory Visit: Payer: Self-pay

## 2021-10-26 DIAGNOSIS — E782 Mixed hyperlipidemia: Secondary | ICD-10-CM

## 2021-10-26 MED ORDER — ATORVASTATIN CALCIUM 10 MG PO TABS: ORAL_TABLET | ORAL | 2 refills | Status: DC

## 2021-10-27 ENCOUNTER — Other Ambulatory Visit: Payer: Self-pay

## 2021-10-27 DIAGNOSIS — E782 Mixed hyperlipidemia: Secondary | ICD-10-CM

## 2021-10-27 MED ORDER — ATORVASTATIN CALCIUM 10 MG PO TABS
ORAL_TABLET | ORAL | 1 refills | Status: DC
Start: 2021-10-27 — End: 2022-06-02

## 2021-10-28 ENCOUNTER — Encounter: Payer: Self-pay | Admitting: Dermatology

## 2021-10-28 NOTE — Progress Notes (Signed)
° °  New Patient   Subjective  Cheryl Joyce is a 41 y.o. female who presents for the following: Annual Exam (Pt here for exam of face and neck. Pt states that she has a lot of moles and wants them evaluated).  Growths on face and neck Location:  Duration:  Quality:  Associated Signs/Symptoms: Modifying Factors:  Severity:  Timing: Context:    The following portions of the chart were reviewed this encounter and updated as appropriate:  Tobacco   Allergies   Meds   Problems   Med Hx   Surg Hx   Fam Hx       Objective  Well appearing patient in no apparent distress; mood and affect are within normal limits. Head - Anterior (Face), Neck - Anterior 2 dozen 2 mm hyperpigmented tags periorbital and neck.  Head - Anterior (Face), Neck - Anterior 2+ dozen 2 mm hyperpigmented seborrheic keratoses cheekbones (dermatosis papulosis).  I explained that these for nodule moles and had no potential risk of malignancy.    A focused examination was performed including head, neck, upper torso. Relevant physical exam findings are noted in the Assessment and Plan.   Assessment & Plan  Skin tag (2) Head - Anterior (Face); Neck - Anterior  I explained the benign nature of these common growths and she elects to leave them including a larger more verrucous papule left zygoma.  Seborrheic keratosis (2) Head - Anterior (Face); Neck - Anterior  Patient chooses to leave these.  Follow-up can be on a as needed basis.

## 2021-12-08 ENCOUNTER — Ambulatory Visit (INDEPENDENT_AMBULATORY_CARE_PROVIDER_SITE_OTHER): Payer: No Typology Code available for payment source | Admitting: Nurse Practitioner

## 2021-12-08 ENCOUNTER — Encounter: Payer: Self-pay | Admitting: Nurse Practitioner

## 2021-12-08 VITALS — BP 120/70 | HR 68 | Temp 98.3°F | Ht 71.0 in | Wt 229.0 lb

## 2021-12-08 DIAGNOSIS — E6609 Other obesity due to excess calories: Secondary | ICD-10-CM | POA: Diagnosis not present

## 2021-12-08 DIAGNOSIS — E782 Mixed hyperlipidemia: Secondary | ICD-10-CM | POA: Diagnosis not present

## 2021-12-08 DIAGNOSIS — Z6831 Body mass index (BMI) 31.0-31.9, adult: Secondary | ICD-10-CM | POA: Diagnosis not present

## 2021-12-08 NOTE — Addendum Note (Signed)
Addended by: Minette Brine F on: 12/08/2021 11:41 AM ? ? Modules accepted: Orders ? ?

## 2021-12-08 NOTE — Patient Instructions (Signed)

## 2021-12-08 NOTE — Progress Notes (Signed)
?Industrial/product designer as a Education administrator for [employer], FNP.,have documented all relevant documentation on the behalf of Minette Brine, FNP,as directed by  Minette Brine, FNP while in the presence of Minette Brine, [place]. ? ?This visit occurred during the SARS-CoV-2 public health emergency.  Safety protocols were in place, including screening questions prior to the visit, additional usage of staff PPE, and extensive cleaning of exam room while observing appropriate contact time as indicated for disinfecting solutions. ? ?Subjective:  ?  ? Patient ID: Cheryl Joyce , female    DOB: 22-Jun-1981 , 41 y.o.   MRN: [id number] ? ? ?Chief Complaint  ?Patient presents with  ? Hyperlipidemia  ? ? ?HPI ? ?Patient presents today for cholesterol follow up. She had been on cholesterol medications several years ago.  She has changed a lot of her eating habits. She is more a salad. She does eat cheese on her salad.  ? ?Wt Readings from Last 3 Encounters: ?12/08/21 : 229 lb (103.9 kg) ?03/31/21 : 219 lb 6.4 oz (99.5 kg) ?12/29/20 : 225 lb 6.4 oz (102.2 kg) ? ? ? ?Hyperlipidemia ?This is a chronic problem. The current episode started more than 1 year ago. The problem is controlled. She has no history of chronic renal disease. There are no known factors aggravating her hyperlipidemia. She is currently on no antihyperlipidemic treatment. Risk factors for coronary artery disease include obesity and dyslipidemia.   ? ?History reviewed. No pertinent past medical history.  ? ?Family History  ?Problem Relation Age of Onset  ? Hypertension Mother   ? Hyperlipidemia Mother   ? Thyroid disease Mother   ? Cancer Sister   ? Hypertension Maternal Grandmother   ? Hypertension Maternal Grandfather   ? Hypertension Paternal Grandmother   ? Hypertension Paternal Grandfather   ? Breast cancer Neg Hx   ? ? ? ?Current Outpatient Medications:  ?  atorvastatin (LIPITOR) 10 MG tablet, Take 1 tablet by mouth on M, W, F, Disp: 90 tablet, Rfl: 1 ?   Cholecalciferol (VITAMIN D) 125 MCG (5000 UT) CAPS, 1 tablet, Disp: , Rfl:  ?  Multiple Vitamin (MULTI VITAMIN) TABS, 1 tablet, Disp: , Rfl:   ? ?Allergies  ?Allergen Reactions  ? Penicillins Itching and Rash  ?  ? ?Review of Systems  ?Constitutional: Negative.   ?Respiratory: Negative.    ?Cardiovascular: Negative.   ?Gastrointestinal: Negative.   ?Neurological: Negative.    ? ?Today's Vitals  ? 12/08/21 1056  ?BP: 120/70  ?Pulse: 68  ?Temp: 98.3 ?F (36.8 ?C)  ?TempSrc: Oral  ?Weight: 229 lb (103.9 kg)  ?Height: '5\' 11"'$  (1.803 m)  ? ?Body mass index is 31.94 kg/m?.  ?Wt Readings from Last 3 Encounters:  ?12/08/21 229 lb (103.9 kg)  ?03/31/21 219 lb 6.4 oz (99.5 kg)  ?12/29/20 225 lb 6.4 oz (102.2 kg)  ? ? ?Objective:  ?Physical Exam ?Vitals reviewed.  ?Constitutional:   ?   Appearance: Normal appearance. She is well-developed.  ?HENT:  ?   Head: Normocephalic and atraumatic.  ?Eyes:  ?   Pupils: Pupils are equal, round, and reactive to light.  ?Cardiovascular:  ?   Rate and Rhythm: Normal rate and regular rhythm.  ?   Pulses: Normal pulses.  ?   Heart sounds: Normal heart sounds. No murmur heard. ?Pulmonary:  ?   Effort: Pulmonary effort is normal. No respiratory distress.  ?   Breath sounds: Normal breath sounds. No wheezing.  ?Skin: ?   General: Skin is warm and  dry.  ?   Capillary Refill: Capillary refill takes less than 2 seconds.  ?Neurological:  ?   General: No focal deficit present.  ?   Mental Status: She is alert and oriented to person, place, and time.  ?   Cranial Nerves: No cranial nerve deficit.  ?   Motor: No weakness.  ?Psychiatric:     ?   Mood and Affect: Mood normal.     ?   Behavior: Behavior normal.     ?   Thought Content: Thought content normal.     ?   Judgment: Judgment normal.  ?  ? ?   ?Assessment And Plan:  ?   ?1. Mixed hyperlipidemia ?Comments: Advised to take her statin at night to help with leg cramps. Continue with flaxseed.  ?- Lipid panel ? ?2. Class 1 obesity due to excess calories  without serious comorbidity with body mass index (BMI) of 31.0 to 31.9 in adult ?Comments: Discussed focusing on staying active.  ?She is encouraged to strive for BMI less than 30 to decrease cardiac risk. Advised to aim for at least 150 minutes of exercise per week. ?  ? ? ?Patient was given opportunity to ask questions. Patient verbalized understanding of the plan and was able to repeat key elements of the plan. All questions were answered to their satisfaction.  ?Minette Brine, FNP  ? ?I, Minette Brine, FNP, have reviewed all documentation for this visit. The documentation on 12/08/21 for the exam, diagnosis, procedures, and orders are all accurate and complete.  ? ?IF YOU HAVE BEEN REFERRED TO A SPECIALIST, IT MAY TAKE 1-2 WEEKS TO SCHEDULE/PROCESS THE REFERRAL. IF YOU HAVE NOT HEARD FROM US/SPECIALIST IN TWO WEEKS, PLEASE GIVE [place] A CALL AT [phone] X [phone].  ? ?THE PATIENT IS ENCOURAGED TO PRACTICE SOCIAL DISTANCING DUE TO THE COVID-19 PANDEMIC.   ?

## 2021-12-09 LAB — LIPID PANEL
Chol/HDL Ratio: 3.5 ratio (ref 0.0–4.4)
Cholesterol, Total: 180 mg/dL (ref 100–199)
HDL: 51 mg/dL (ref >39)
LDL Chol Calc (NIH): 117 mg/dL — ABNORMAL HIGH (ref 0–99)
Triglycerides: 62 mg/dL (ref 0–149)
VLDL Cholesterol Cal: 12 mg/dL (ref 5–40)

## 2021-12-09 LAB — CMP14+EGFR
ALT: 17 IU/L (ref 0–32)
AST: 26 IU/L (ref 0–40)
Albumin/Globulin Ratio: 1.3 (ref 1.2–2.2)
Albumin: 4.4 g/dL (ref 3.8–4.8)
Alkaline Phosphatase: 86 IU/L (ref 44–121)
BUN/Creatinine Ratio: 6 — ABNORMAL LOW (ref 9–23)
BUN: 5 mg/dL — ABNORMAL LOW (ref 6–24)
Bilirubin Total: 0.4 mg/dL (ref 0.0–1.2)
CO2: 24 mmol/L (ref 20–29)
Calcium: 10 mg/dL (ref 8.7–10.2)
Chloride: 102 mmol/L (ref 96–106)
Creatinine, Ser: 0.81 mg/dL (ref 0.57–1.00)
Globulin, Total: 3.3 g/dL (ref 1.5–4.5)
Glucose: 84 mg/dL (ref 70–99)
Potassium: 4.5 mmol/L (ref 3.5–5.2)
Sodium: 138 mmol/L (ref 134–144)
Total Protein: 7.7 g/dL (ref 6.0–8.5)
eGFR: 93 mL/min/{1.73_m2} (ref >59)

## 2022-04-05 ENCOUNTER — Encounter: Payer: 59 | Admitting: Nurse Practitioner

## 2022-04-12 ENCOUNTER — Encounter: Payer: 59 | Admitting: Nurse Practitioner

## 2022-05-15 ENCOUNTER — Ambulatory Visit (INDEPENDENT_AMBULATORY_CARE_PROVIDER_SITE_OTHER): Payer: 59 | Admitting: Nurse Practitioner

## 2022-05-15 ENCOUNTER — Encounter: Payer: Self-pay | Admitting: Nurse Practitioner

## 2022-05-15 VITALS — BP 110/70 | HR 82 | Temp 98.9°F | Ht 71.0 in | Wt 225.4 lb

## 2022-05-15 DIAGNOSIS — E782 Mixed hyperlipidemia: Secondary | ICD-10-CM

## 2022-05-15 DIAGNOSIS — Z23 Encounter for immunization: Secondary | ICD-10-CM

## 2022-05-15 DIAGNOSIS — Z0000 Encounter for general adult medical examination without abnormal findings: Secondary | ICD-10-CM

## 2022-05-15 DIAGNOSIS — E559 Vitamin D deficiency, unspecified: Secondary | ICD-10-CM | POA: Diagnosis not present

## 2022-05-15 NOTE — Progress Notes (Signed)
Barnet Glasgow Martin,acting as a Education administrator for Minette Brine, FNP.,have documented all relevant documentation on the behalf of Minette Brine, FNP,as directed by  Minette Brine, FNP while in the presence of Minette Brine, [place].   Subjective:     Patient ID: Cheryl Joyce , female    DOB: 10/07/1980 , 41 y.o.   MRN: [id number]   Chief Complaint  Patient presents with   Annual Exam    HPI  Patient presents today for HM. Patient goes to [employer] she will get pap in November.  BP Readings from Last 3 Encounters: 05/15/22 : 110/70 12/08/21 : 120/70 03/31/21 : 120/84       History reviewed. No pertinent past medical history.   Family History  Problem Relation Age of Onset   Hypertension Mother    Hyperlipidemia Mother    Thyroid disease Mother    Cancer Sister    Hypertension Maternal Grandmother    Hypertension Maternal Grandfather    Hypertension Paternal Grandmother    Hypertension Paternal Grandfather    Breast cancer Neg Hx      Current Outpatient Medications:    atorvastatin (LIPITOR) 10 MG tablet, Take 1 tablet by mouth on M, W, F, Disp: 90 tablet, Rfl: 1   Cholecalciferol (VITAMIN D) 125 MCG (5000 UT) CAPS, 1 tablet, Disp: , Rfl:    Multiple Vitamin (MULTI VITAMIN) TABS, 1 tablet, Disp: , Rfl:    Allergies  Allergen Reactions   Penicillins Itching and Rash      The patient states she uses none for birth control. Patient's last menstrual period was 04/14/2022 (exact date).. Negative for Dysmenorrhea and Negative for Menorrhagia. Negative for: breast discharge, breast lump(s), breast pain and breast self exam. Associated symptoms include abnormal vaginal bleeding. Pertinent negatives include abnormal bleeding (hematology), anxiety, decreased libido, depression, difficulty falling sleep, dyspareunia, history of infertility, nocturia, sexual dysfunction, sleep disturbances, urinary incontinence, urinary urgency, vaginal discharge and vaginal itching. Diet regular; she does  not eat a lot of fried foods. She mostly eats salads. The patient states her exercise level is minimal - she has not been in the last 2 weeks. 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 No strength training.   The patient's tobacco use is:  Social History   Tobacco Use  Smoking Status Never  Smokeless Tobacco Never  She has been exposed to passive smoke. The patient's alcohol use is:  Social History   Substance and Sexual Activity  Alcohol Use Not Currently   Additional information: Last pap 04/2019, next one scheduled for 06/2022.    Review of Systems  Constitutional: Negative.   HENT: Negative.    Eyes: Negative.   Respiratory: Negative.    Cardiovascular: Negative.   Gastrointestinal: Negative.   Endocrine: Negative.   Genitourinary: Negative.   Musculoskeletal: Negative.   Skin: Negative.   Allergic/Immunologic: Negative.   Neurological: Negative.   Hematological: Negative.   Psychiatric/Behavioral: Negative.       Today's Vitals   05/15/22 1142  BP: 110/70  Pulse: 82  Temp: 98.9 F (37.2 C)  TempSrc: Oral  Weight: 225 lb 6.4 oz (102.2 kg)  Height: _0  (1.803 m)  PainSc: 0-No pain   Body mass index is 31.44 kg/m.  Wt Readings from Last 3 Encounters:  05/15/22 225 lb 6.4 oz (102.2 kg)  12/08/21 229 lb (103.9 kg)  03/31/21 219 lb 6.4 oz (99.5 kg)    Objective:  Physical Exam Vitals reviewed.  Constitutional:  General: She is not in acute distress.    Appearance: Normal appearance. She is well-developed. She is obese.  HENT:     Head: Normocephalic and atraumatic.     Right Ear: Hearing, tympanic membrane, ear canal and external ear normal. There is no impacted cerumen.     Left Ear: Hearing, tympanic membrane, ear canal and external ear normal. There is no impacted cerumen.     Nose:     Comments: Deferred - masked    Mouth/Throat:     Comments: Deferred - masked Eyes:     General: Lids are normal.     Extraocular  Movements: Extraocular movements intact.     Conjunctiva/sclera: Conjunctivae normal.     Pupils: Pupils are equal, round, and reactive to light.     Funduscopic exam:    Right eye: No papilledema.        Left eye: No papilledema.  Neck:     Thyroid: No thyroid mass.     Vascular: No carotid bruit.  Cardiovascular:     Rate and Rhythm: Normal rate and regular rhythm.     Pulses: Normal pulses.     Heart sounds: Normal heart sounds. No murmur heard. Pulmonary:     Effort: Pulmonary effort is normal.     Breath sounds: Normal breath sounds.  Chest:     Chest wall: No mass.  Breasts:    Tanner Score is 5.     Right: Normal. No mass or tenderness.     Left: Normal. No mass or tenderness.  Abdominal:     General: Abdomen is flat. Bowel sounds are normal. There is no distension.     Palpations: Abdomen is soft.     Tenderness: There is no abdominal tenderness.  Genitourinary:    Rectum: Guaiac result negative.  Musculoskeletal:        General: No swelling. Normal range of motion.     Cervical back: Full passive range of motion without pain, normal range of motion and neck supple.     Right lower leg: No edema.     Left lower leg: No edema.  Lymphadenopathy:     Upper Body:     Right upper body: No supraclavicular, axillary or pectoral adenopathy.     Left upper body: No supraclavicular, axillary or pectoral adenopathy.  Skin:    General: Skin is warm and dry.     Capillary Refill: Capillary refill takes less than 2 seconds.  Neurological:     General: No focal deficit present.     Mental Status: She is alert and oriented to person, place, and time.     Cranial Nerves: No cranial nerve deficit.     Sensory: No sensory deficit.  Psychiatric:        Mood and Affect: Mood normal.        Behavior: Behavior normal.        Thought Content: Thought content normal.        Judgment: Judgment normal.         Assessment And Plan:     1. Annual physical exam Behavior  modifications discussed and diet history reviewed.   Pt will continue to exercise regularly and modify diet with low GI, plant based foods and decrease intake of processed foods.  Recommend intake of daily multivitamin, Vitamin D, and calcium.  Recommend mammogram for preventive screenings, as well as recommend immunizations that include influenza, TDAP - CBC no Diff  2. Mixed hyperlipidemia Comments: Cholesterol levels  are stable, continue statin, tolerating well  - Hemoglobin A1c - Lipid panel - CMP14+EGFR  3. Vitamin D deficiency Will check vitamin D level and supplement as needed.    Also encouraged to spend 15 minutes in the sun daily.  - VITAMIN D 25 Hydroxy (Vit-D Deficiency, Fractures)    Patient was given opportunity to ask questions. Patient verbalized understanding of the plan and was able to repeat key elements of the plan. All questions were answered to their satisfaction.   Minette Brine, FNP   I, Minette Brine, FNP, have reviewed all documentation for this visit. The documentation on 05/15/22 for the exam, diagnosis, procedures, and orders are all accurate and complete.   THE PATIENT IS ENCOURAGED TO PRACTICE SOCIAL DISTANCING DUE TO THE COVID-19 PANDEMIC.

## 2022-05-15 NOTE — Patient Instructions (Signed)

## 2022-05-16 LAB — CBC
Hematocrit: 41.7 % (ref 34.0–46.6)
Hemoglobin: 13.5 g/dL (ref 11.1–15.9)
MCH: 26.5 pg — ABNORMAL LOW (ref 26.6–33.0)
MCHC: 32.4 g/dL (ref 31.5–35.7)
MCV: 82 fL (ref 79–97)
Platelets: 209 10*3/uL (ref 150–450)
RBC: 5.1 x10E6/uL (ref 3.77–5.28)
RDW: 13.1 % (ref 11.7–15.4)
WBC: 7.3 10*3/uL (ref 3.4–10.8)

## 2022-05-16 LAB — CMP14+EGFR
ALT: 16 IU/L (ref 0–32)
AST: 21 IU/L (ref 0–40)
Albumin/Globulin Ratio: 1.2 (ref 1.2–2.2)
Albumin: 4.7 g/dL (ref 3.9–4.9)
Alkaline Phosphatase: 87 IU/L (ref 44–121)
BUN/Creatinine Ratio: 8 — ABNORMAL LOW (ref 9–23)
BUN: 7 mg/dL (ref 6–24)
Bilirubin Total: 0.4 mg/dL (ref 0.0–1.2)
CO2: 22 mmol/L (ref 20–29)
Calcium: 10 mg/dL (ref 8.7–10.2)
Chloride: 103 mmol/L (ref 96–106)
Creatinine, Ser: 0.87 mg/dL (ref 0.57–1.00)
Globulin, Total: 3.8 g/dL (ref 1.5–4.5)
Glucose: 88 mg/dL (ref 70–99)
Potassium: 4.3 mmol/L (ref 3.5–5.2)
Sodium: 139 mmol/L (ref 134–144)
Total Protein: 8.5 g/dL (ref 6.0–8.5)
eGFR: 85 mL/min/{1.73_m2} (ref >59)

## 2022-05-16 LAB — LIPID PANEL
Chol/HDL Ratio: 4.2 ratio (ref 0.0–4.4)
Cholesterol, Total: 203 mg/dL — ABNORMAL HIGH (ref 100–199)
HDL: 48 mg/dL (ref >39)
LDL Chol Calc (NIH): 141 mg/dL — ABNORMAL HIGH (ref 0–99)
Triglycerides: 77 mg/dL (ref 0–149)
VLDL Cholesterol Cal: 14 mg/dL (ref 5–40)

## 2022-05-16 LAB — HEMOGLOBIN A1C
Est. average glucose Bld gHb Est-mCnc: 111 mg/dL
Hgb A1c MFr Bld: 5.5 % (ref 4.8–5.6)

## 2022-05-16 LAB — VITAMIN D 25 HYDROXY (VIT D DEFICIENCY, FRACTURES): Vit D, 25-Hydroxy: 49.7 ng/mL (ref 30.0–100.0)

## 2022-06-02 ENCOUNTER — Other Ambulatory Visit: Payer: Self-pay

## 2022-06-02 DIAGNOSIS — E782 Mixed hyperlipidemia: Secondary | ICD-10-CM

## 2022-06-02 MED ORDER — ATORVASTATIN CALCIUM 10 MG PO TABS: ORAL_TABLET | ORAL | 1 refills | Status: DC

## 2022-06-05 ENCOUNTER — Other Ambulatory Visit: Payer: Self-pay | Admitting: Nurse Practitioner

## 2022-06-05 DIAGNOSIS — Z1231 Encounter for screening mammogram for malignant neoplasm of breast: Secondary | ICD-10-CM

## 2022-07-05 ENCOUNTER — Ambulatory Visit
Admission: RE | Admit: 2022-07-05 | Discharge: 2022-07-05 | Disposition: A | Discharge status: Home / Self Care | Payer: No Typology Code available for payment source | Source: Ambulatory Visit | Attending: Nurse Practitioner | Admitting: Nurse Practitioner

## 2022-07-05 DIAGNOSIS — Z1231 Encounter for screening mammogram for malignant neoplasm of breast: Secondary | ICD-10-CM

## 2023-05-23 ENCOUNTER — Ambulatory Visit: Payer: No Typology Code available for payment source | Admitting: Nurse Practitioner

## 2023-05-23 ENCOUNTER — Encounter: Payer: Self-pay | Admitting: Nurse Practitioner

## 2023-05-23 VITALS — BP 110/80 | HR 67 | Temp 97.7°F | Ht 71.0 in | Wt 232.4 lb

## 2023-05-23 DIAGNOSIS — E6609 Other obesity due to excess calories: Secondary | ICD-10-CM

## 2023-05-23 DIAGNOSIS — Z6832 Body mass index (BMI) 32.0-32.9, adult: Secondary | ICD-10-CM

## 2023-05-23 DIAGNOSIS — E782 Mixed hyperlipidemia: Secondary | ICD-10-CM | POA: Diagnosis not present

## 2023-05-23 DIAGNOSIS — Z0000 Encounter for general adult medical examination without abnormal findings: Secondary | ICD-10-CM | POA: Insufficient documentation

## 2023-05-23 DIAGNOSIS — Z2821 Immunization not carried out because of patient refusal: Secondary | ICD-10-CM

## 2023-05-23 DIAGNOSIS — E559 Vitamin D deficiency, unspecified: Secondary | ICD-10-CM

## 2023-05-23 DIAGNOSIS — Z1159 Encounter for screening for other viral diseases: Secondary | ICD-10-CM

## 2023-05-23 DIAGNOSIS — Z79899 Other long term (current) drug therapy: Secondary | ICD-10-CM

## 2023-05-23 MED ORDER — ATORVASTATIN CALCIUM 10 MG PO TABS: ORAL_TABLET | ORAL | Status: DC

## 2023-05-24 LAB — CBC WITH DIFFERENTIAL/PLATELET
Basophils Absolute: 0 10*3/uL (ref 0.0–0.2)
Basos: 1 %
EOS (ABSOLUTE): 0.1 10*3/uL (ref 0.0–0.4)
Eos: 1 %
Hematocrit: 40.8 % (ref 34.0–46.6)
Hemoglobin: 13.2 g/dL (ref 11.1–15.9)
Immature Grans (Abs): 0 10*3/uL (ref 0.0–0.1)
Immature Granulocytes: 0 %
Lymphocytes Absolute: 2.5 10*3/uL (ref 0.7–3.1)
Lymphs: 39 %
MCH: 26.6 pg (ref 26.6–33.0)
MCHC: 32.4 g/dL (ref 31.5–35.7)
MCV: 82 fL (ref 79–97)
Monocytes Absolute: 0.5 10*3/uL (ref 0.1–0.9)
Monocytes: 7 %
Neutrophils Absolute: 3.4 10*3/uL (ref 1.4–7.0)
Neutrophils: 52 %
Platelets: 221 10*3/uL (ref 150–450)
RBC: 4.96 x10E6/uL (ref 3.77–5.28)
RDW: 13.3 % (ref 11.7–15.4)
WBC: 6.5 10*3/uL (ref 3.4–10.8)

## 2023-05-24 LAB — HEMOGLOBIN A1C
Est. average glucose Bld gHb Est-mCnc: 117 mg/dL
Hgb A1c MFr Bld: 5.7 % — ABNORMAL HIGH (ref 4.8–5.6)

## 2023-05-24 LAB — CMP14+EGFR
ALT: 19 IU/L (ref 0–32)
AST: 24 IU/L (ref 0–40)
Albumin: 4.5 g/dL (ref 3.9–4.9)
Alkaline Phosphatase: 84 IU/L (ref 44–121)
BUN/Creatinine Ratio: 8 — ABNORMAL LOW (ref 9–23)
BUN: 6 mg/dL (ref 6–24)
Bilirubin Total: 0.3 mg/dL (ref 0.0–1.2)
CO2: 21 mmol/L (ref 20–29)
Calcium: 9.9 mg/dL (ref 8.7–10.2)
Chloride: 101 mmol/L (ref 96–106)
Creatinine, Ser: 0.79 mg/dL (ref 0.57–1.00)
Globulin, Total: 3.5 g/dL (ref 1.5–4.5)
Glucose: 87 mg/dL (ref 70–99)
Potassium: 4.3 mmol/L (ref 3.5–5.2)
Sodium: 138 mmol/L (ref 134–144)
Total Protein: 8 g/dL (ref 6.0–8.5)
eGFR: 95 mL/min/{1.73_m2} (ref >59)

## 2023-05-24 LAB — LIPID PANEL
Chol/HDL Ratio: 3.5 ratio (ref 0.0–4.4)
Cholesterol, Total: 170 mg/dL (ref 100–199)
HDL: 49 mg/dL (ref >39)
LDL Chol Calc (NIH): 107 mg/dL — ABNORMAL HIGH (ref 0–99)
Triglycerides: 71 mg/dL (ref 0–149)
VLDL Cholesterol Cal: 14 mg/dL (ref 5–40)

## 2023-05-24 LAB — VITAMIN D 25 HYDROXY (VIT D DEFICIENCY, FRACTURES): Vit D, 25-Hydroxy: 72.4 ng/mL (ref 30.0–100.0)

## 2023-05-24 LAB — HEPATITIS C ANTIBODY: Hep C Virus Ab: NONREACTIVE

## 2023-06-07 ENCOUNTER — Other Ambulatory Visit (HOSPITAL_BASED_OUTPATIENT_CLINIC_OR_DEPARTMENT_OTHER): Payer: Self-pay

## 2023-06-07 MED ORDER — COMIRNATY 30 MCG/0.3ML IM SUSY
PREFILLED_SYRINGE | INTRAMUSCULAR | 0 refills | Status: AC
  Filled 2023-06-07: qty 0.3, 1d supply, fill #0

## 2023-06-12 DIAGNOSIS — Z1159 Encounter for screening for other viral diseases: Secondary | ICD-10-CM | POA: Insufficient documentation

## 2023-06-12 NOTE — Assessment & Plan Note (Signed)
Cholesterol levels are stable, continue focusing on fried and fatty foods.

## 2023-06-12 NOTE — Assessment & Plan Note (Signed)
Will check Hepatitis C screening due to recent recommendations to screen all adults 18 years and older

## 2023-06-12 NOTE — Assessment & Plan Note (Signed)
She is encouraged to strive for BMI less than 30 to decrease cardiac risk. Advised to aim for at least 150 minutes of exercise per week.  

## 2023-06-12 NOTE — Assessment & Plan Note (Signed)
Will check vitamin D level and supplement as needed.    Also encouraged to spend 15 minutes in the sun daily.   

## 2023-06-12 NOTE — Assessment & Plan Note (Signed)
Behavior modifications discussed and diet history reviewed.   Pt will continue to exercise regularly and modify diet with low GI, plant based foods and decrease intake of processed foods.  Recommend intake of daily multivitamin, Vitamin D, and calcium.  Recommend mammogram for preventive screenings, as well as recommend immunizations that include influenza, TDAP

## 2023-06-12 NOTE — Assessment & Plan Note (Signed)

## 2023-06-26 ENCOUNTER — Other Ambulatory Visit: Payer: Self-pay | Admitting: Nurse Practitioner

## 2023-06-26 DIAGNOSIS — Z1231 Encounter for screening mammogram for malignant neoplasm of breast: Secondary | ICD-10-CM

## 2023-07-02 ENCOUNTER — Other Ambulatory Visit: Payer: Self-pay

## 2023-07-02 DIAGNOSIS — E782 Mixed hyperlipidemia: Secondary | ICD-10-CM

## 2023-07-02 MED ORDER — ATORVASTATIN CALCIUM 10 MG PO TABS: ORAL_TABLET | ORAL | 1 refills | Status: DC

## 2023-07-25 ENCOUNTER — Ambulatory Visit
Admission: RE | Admit: 2023-07-25 | Discharge: 2023-07-25 | Disposition: A | Discharge status: Home / Self Care | Payer: No Typology Code available for payment source | Source: Ambulatory Visit | Attending: Nurse Practitioner | Admitting: Nurse Practitioner

## 2023-07-25 DIAGNOSIS — Z1231 Encounter for screening mammogram for malignant neoplasm of breast: Secondary | ICD-10-CM

## 2023-11-13 ENCOUNTER — Ambulatory Visit (INDEPENDENT_AMBULATORY_CARE_PROVIDER_SITE_OTHER): Payer: Self-pay | Admitting: Nurse Practitioner

## 2023-11-13 ENCOUNTER — Encounter: Payer: Self-pay | Admitting: Nurse Practitioner

## 2023-11-13 VITALS — BP 118/80 | HR 85 | Temp 97.9°F | Ht 71.0 in | Wt 232.4 lb

## 2023-11-13 DIAGNOSIS — E782 Mixed hyperlipidemia: Secondary | ICD-10-CM | POA: Diagnosis not present

## 2023-11-13 DIAGNOSIS — R5383 Other fatigue: Secondary | ICD-10-CM

## 2023-11-13 DIAGNOSIS — R21 Rash and other nonspecific skin eruption: Secondary | ICD-10-CM | POA: Diagnosis not present

## 2023-11-13 MED ORDER — ATORVASTATIN CALCIUM 10 MG PO TABS: ORAL_TABLET | ORAL | 11 refills | Status: AC

## 2023-11-13 MED ORDER — ATORVASTATIN CALCIUM 10 MG PO TABS: ORAL_TABLET | ORAL | 11 refills | Status: DC

## 2023-11-13 NOTE — Progress Notes (Signed)
 Madelaine Bhat, CMA,acting as a Neurosurgeon for Arnette Felts, FNP.,have documented all relevant documentation on the behalf of Arnette Felts, FNP,as directed by  Arnette Felts, FNP while in the presence of Arnette Felts, FNP.  Subjective:  Patient ID: Cheryl Joyce , female    DOB: [birth year] , 43 y.o.   MRN: [id number]  No chief complaint on file.   HPI  Patient presents today for right ear pain last week but has now subsided. She took an allergy medication and when she tilted her head to the right she would be dizzy. Patient reports it just went away on its own.   Patient reports she has had fatigue for the past 2 weeks. She is not sleeping through the night will wake up about 330 am, she is conditioned to work starting at 5 am. Melene Plan to bed about 10am. Sometimes will feel refreshed. She does have a heavy menstrual cycle for one day. She is taking a vitamin d supplement and multivitamin.      History reviewed. No pertinent past medical history.   Family History  Problem Relation Age of Onset   Hypertension Mother    Hyperlipidemia Mother    Thyroid disease Mother    Cancer Sister    Hypertension Maternal Grandmother    Hypertension Maternal Grandfather    Hypertension Paternal Grandmother    Hypertension Paternal Grandfather    Breast cancer Neg Hx      Current Outpatient Medications:    Cholecalciferol (VITAMIN D) 125 MCG (5000 UT) CAPS, , Disp: , Rfl:    COVID-19 mRNA vaccine, Pfizer, (COMIRNATY) syringe, Inject into the muscle., Disp: 0.3 mL, Rfl: 0   Multiple Vitamin (MULTI VITAMIN) TABS, 1 tablet, Disp: , Rfl:    atorvastatin (LIPITOR) 10 MG tablet, Take 1 tablet by mouth on M, W, F, Disp: 12 tablet, Rfl: 11   Allergies  Allergen Reactions   Penicillins Itching and Rash     Review of Systems  Constitutional:  Positive for fatigue.  HENT: Negative.    Eyes: Negative.   Respiratory: Negative.    Cardiovascular: Negative.   Gastrointestinal: Negative.   Neurological:  Negative.   Psychiatric/Behavioral: Negative.       Today's Vitals   11/13/23 1127  BP: 118/80  Pulse: 85  Temp: 97.9 F (36.6 C)  TempSrc: Oral  Weight: 232 lb 6.4 oz (105.4 kg)  Height: 5\' 11"  (1.803 m)  PainSc: 0-No pain   Body mass index is 32.41 kg/m.  Wt Readings from Last 3 Encounters:  11/13/23 232 lb 6.4 oz (105.4 kg)  05/23/23 232 lb 6.4 oz (105.4 kg)  05/15/22 225 lb 6.4 oz (102.2 kg)     Objective:  Physical Exam Vitals reviewed.  Constitutional:      General: She is not in acute distress.    Appearance: Normal appearance. She is well-developed. She is obese.  Eyes:     Pupils: Pupils are equal, round, and reactive to light.  Cardiovascular:     Rate and Rhythm: Normal rate and regular rhythm.     Pulses: Normal pulses.     Heart sounds: Normal heart sounds. No murmur heard. Pulmonary:     Effort: Pulmonary effort is normal. No respiratory distress.     Breath sounds: Normal breath sounds. No wheezing.  Skin:    General: Skin is warm and dry.     Capillary Refill: Capillary refill takes less than 2 seconds.  Neurological:     General: No focal deficit  present.     Mental Status: She is alert and oriented to person, place, and time.     Cranial Nerves: No cranial nerve deficit.     Motor: No weakness.  Psychiatric:        Mood and Affect: Mood normal.        Behavior: Behavior normal.        Thought Content: Thought content normal.        Judgment: Judgment normal.         Assessment And Plan:  Fatigue, unspecified type Assessment & Plan: Will check for metabolic causes.   Orders: -     Iron, TIBC and Ferritin Panel -     Vitamin B12 -     VITAMIN D 25 Hydroxy (Vit-D Deficiency, Fractures) -     CBC with Differential/Platelet -     TSH  Mixed hyperlipidemia Assessment & Plan: Cholesterol levels are stable, continue focusing on fried and fatty foods.   Orders: -     Lipid panel -     Atorvastatin Calcium; Take 1 tablet by mouth on M,  W, F  Dispense: 12 tablet; Refill: 11  Rash and nonspecific skin eruption    No follow-ups on file.  Patient was given opportunity to ask questions. Patient verbalized understanding of the plan and was able to repeat key elements of the plan. All questions were answered to their satisfaction.    Jeanell Sparrow, FNP, have reviewed all documentation for this visit. The documentation on 11/13/23 for the exam, diagnosis, procedures, and orders are all accurate and complete.   IF YOU HAVE BEEN REFERRED TO A SPECIALIST, IT MAY TAKE 1-2 WEEKS TO SCHEDULE/PROCESS THE REFERRAL. IF YOU HAVE NOT HEARD FROM US/SPECIALIST IN TWO WEEKS, PLEASE GIVE [place] A CALL AT [phone] X [phone].

## 2023-11-14 ENCOUNTER — Encounter: Payer: Self-pay | Admitting: Nurse Practitioner

## 2023-11-14 LAB — LIPID PANEL
Chol/HDL Ratio: 3.7 ratio (ref 0.0–4.4)
Cholesterol, Total: 176 mg/dL (ref 100–199)
HDL: 47 mg/dL (ref >39)
LDL Chol Calc (NIH): 115 mg/dL — ABNORMAL HIGH (ref 0–99)
Triglycerides: 72 mg/dL (ref 0–149)
VLDL Cholesterol Cal: 14 mg/dL (ref 5–40)

## 2023-11-14 LAB — VITAMIN D 25 HYDROXY (VIT D DEFICIENCY, FRACTURES): Vit D, 25-Hydroxy: 75.4 ng/mL (ref 30.0–100.0)

## 2023-11-14 LAB — IRON,TIBC AND FERRITIN PANEL
Ferritin: 26 ng/mL (ref 15–150)
Iron Saturation: 19 % (ref 15–55)
Iron: 63 ug/dL (ref 27–159)
Total Iron Binding Capacity: 327 ug/dL (ref 250–450)
UIBC: 264 ug/dL (ref 131–425)

## 2023-11-14 LAB — CBC WITH DIFFERENTIAL/PLATELET
Basophils Absolute: 0 10*3/uL (ref 0.0–0.2)
Basos: 1 %
EOS (ABSOLUTE): 0.1 10*3/uL (ref 0.0–0.4)
Eos: 1 %
Hematocrit: 41 % (ref 34.0–46.6)
Hemoglobin: 13.2 g/dL (ref 11.1–15.9)
Immature Grans (Abs): 0 10*3/uL (ref 0.0–0.1)
Immature Granulocytes: 0 %
Lymphocytes Absolute: 2 10*3/uL (ref 0.7–3.1)
Lymphs: 33 %
MCH: 26 pg — ABNORMAL LOW (ref 26.6–33.0)
MCHC: 32.2 g/dL (ref 31.5–35.7)
MCV: 81 fL (ref 79–97)
Monocytes Absolute: 0.4 10*3/uL (ref 0.1–0.9)
Monocytes: 7 %
Neutrophils Absolute: 3.5 10*3/uL (ref 1.4–7.0)
Neutrophils: 58 %
Platelets: 195 10*3/uL (ref 150–450)
RBC: 5.08 x10E6/uL (ref 3.77–5.28)
RDW: 14.3 % (ref 11.7–15.4)
WBC: 6 10*3/uL (ref 3.4–10.8)

## 2023-11-14 LAB — TSH: TSH: 0.67 u[IU]/mL (ref 0.450–4.500)

## 2023-11-14 LAB — VITAMIN B12: Vitamin B-12: 689 pg/mL (ref 232–1245)

## 2023-11-20 ENCOUNTER — Ambulatory Visit: Payer: No Typology Code available for payment source | Admitting: Nurse Practitioner

## 2023-11-25 DIAGNOSIS — R21 Rash and other nonspecific skin eruption: Secondary | ICD-10-CM | POA: Insufficient documentation

## 2023-11-25 DIAGNOSIS — R5383 Other fatigue: Secondary | ICD-10-CM | POA: Insufficient documentation

## 2023-11-25 NOTE — Assessment & Plan Note (Signed)
Cholesterol levels are stable, continue focusing on fried and fatty foods.

## 2023-11-25 NOTE — Assessment & Plan Note (Signed)
Will check for metabolic causes.

## 2024-05-28 ENCOUNTER — Encounter: Payer: Self-pay | Admitting: Nurse Practitioner

## 2024-06-11 ENCOUNTER — Other Ambulatory Visit: Payer: Self-pay | Admitting: Internal Medicine

## 2024-06-11 DIAGNOSIS — Z1231 Encounter for screening mammogram for malignant neoplasm of breast: Secondary | ICD-10-CM

## 2024-07-28 ENCOUNTER — Ambulatory Visit
Admission: RE | Admit: 2024-07-28 | Discharge: 2024-07-28 | Disposition: A | Source: Ambulatory Visit | Attending: Internal Medicine | Admitting: Internal Medicine

## 2024-07-28 DIAGNOSIS — Z1231 Encounter for screening mammogram for malignant neoplasm of breast: Secondary | ICD-10-CM
